# Patient Record
Sex: Female | Born: 1975 | Race: White | Hispanic: No | Marital: Married | Smoking: Never smoker
Health system: Southern US, Community
[De-identification: ages and names within clinical notes are randomized; demographics above are authoritative.]

## PROBLEM LIST (undated history)

## (undated) DIAGNOSIS — F32A Depression, unspecified: Secondary | ICD-10-CM

## (undated) DIAGNOSIS — F419 Anxiety disorder, unspecified: Secondary | ICD-10-CM

## (undated) DIAGNOSIS — Z8489 Family history of other specified conditions: Secondary | ICD-10-CM

## (undated) DIAGNOSIS — F329 Major depressive disorder, single episode, unspecified: Secondary | ICD-10-CM

## (undated) DIAGNOSIS — N904 Leukoplakia of vulva: Secondary | ICD-10-CM

## (undated) HISTORY — DX: Anxiety disorder, unspecified: F41.9

## (undated) HISTORY — DX: Depression, unspecified: F32.A

## (undated) HISTORY — DX: Leukoplakia of vulva: N90.4

---
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
## 1998-07-08 ENCOUNTER — Other Ambulatory Visit: Admission: RE | Admit: 1998-07-08 | Discharge: 1998-07-08 | Payer: Self-pay | Admitting: *Deleted

## 1999-09-08 ENCOUNTER — Other Ambulatory Visit: Admission: RE | Admit: 1999-09-08 | Discharge: 1999-09-08 | Payer: Self-pay | Admitting: *Deleted

## 2000-07-10 HISTORY — PX: WISDOM TOOTH EXTRACTION: SHX21

## 2001-07-29 ENCOUNTER — Other Ambulatory Visit: Admission: RE | Admit: 2001-07-29 | Discharge: 2001-07-29 | Payer: Self-pay | Admitting: *Deleted

## 2002-08-12 ENCOUNTER — Other Ambulatory Visit: Admission: RE | Admit: 2002-08-12 | Discharge: 2002-08-12 | Payer: Self-pay | Admitting: Obstetrics and Gynecology

## 2003-09-18 ENCOUNTER — Other Ambulatory Visit: Admission: RE | Admit: 2003-09-18 | Discharge: 2003-09-18 | Payer: Self-pay | Admitting: Obstetrics and Gynecology

## 2004-10-07 ENCOUNTER — Other Ambulatory Visit: Admission: RE | Admit: 2004-10-07 | Discharge: 2004-10-07 | Payer: Self-pay | Admitting: Obstetrics and Gynecology

## 2005-11-02 ENCOUNTER — Other Ambulatory Visit: Admission: RE | Admit: 2005-11-02 | Discharge: 2005-11-02 | Payer: Self-pay | Admitting: Obstetrics & Gynecology

## 2006-11-05 ENCOUNTER — Ambulatory Visit (HOSPITAL_COMMUNITY): Admission: RE | Admit: 2006-11-05 | Discharge: 2006-11-05 | Payer: Self-pay | Admitting: Obstetrics and Gynecology

## 2006-11-20 ENCOUNTER — Ambulatory Visit (HOSPITAL_COMMUNITY): Admission: RE | Admit: 2006-11-20 | Discharge: 2006-11-20 | Payer: Self-pay | Admitting: Obstetrics and Gynecology

## 2007-03-24 ENCOUNTER — Inpatient Hospital Stay (HOSPITAL_COMMUNITY): Admission: AD | Admit: 2007-03-24 | Discharge: 2007-03-24 | Payer: Self-pay | Admitting: Obstetrics and Gynecology

## 2007-03-24 ENCOUNTER — Inpatient Hospital Stay (HOSPITAL_COMMUNITY): Admission: AD | Admit: 2007-03-24 | Discharge: 2007-03-27 | Payer: Self-pay | Admitting: Obstetrics & Gynecology

## 2007-12-09 ENCOUNTER — Encounter: Payer: Self-pay | Admitting: Obstetrics & Gynecology

## 2007-12-09 DIAGNOSIS — N904 Leukoplakia of vulva: Secondary | ICD-10-CM

## 2007-12-09 HISTORY — DX: Leukoplakia of vulva: N90.4

## 2008-11-13 ENCOUNTER — Other Ambulatory Visit: Admission: RE | Admit: 2008-11-13 | Discharge: 2008-11-13 | Payer: Self-pay | Admitting: Obstetrics and Gynecology

## 2009-04-27 IMAGING — US US MD EVAL AND MANAGEMENT - NRPT [facility]
1 series · 14 of 16 positions shown · non-contrast
Comparison: none

OBSTETRICAL ULTRASOUND:
 This ultrasound was performed in The [HOSPITAL], and the [facility] report will be stored to [REDACTED] PACS.

[Series 1: us md eval and management - nrpt [facility] · 14 of 95 slices shown]
[im 1/95]
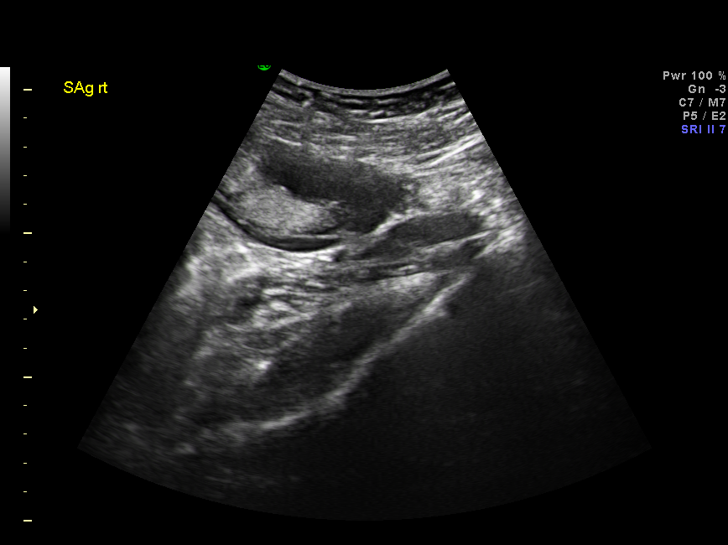
[im 7/95]
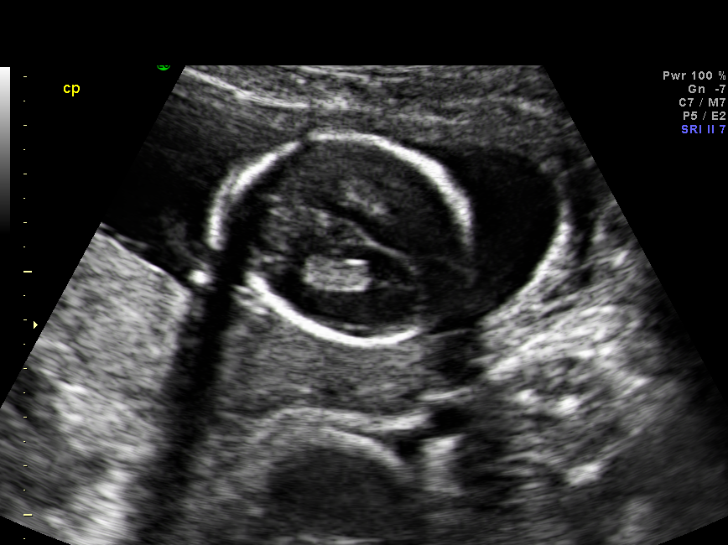
[im 13/95]
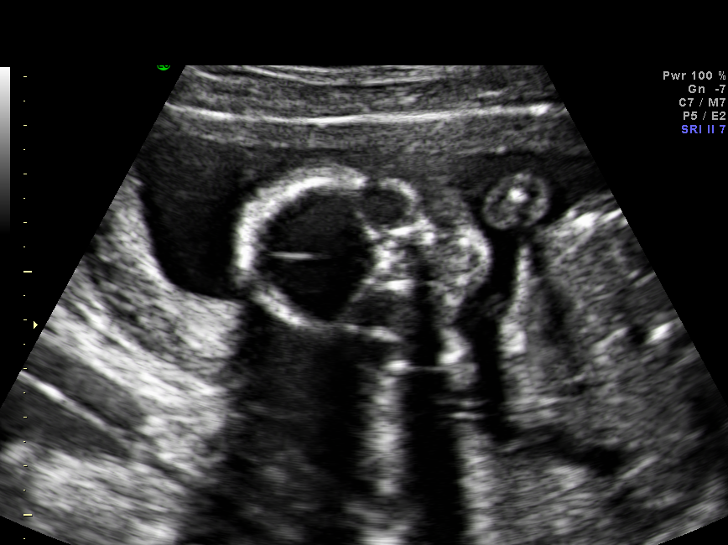
[im 26/95]
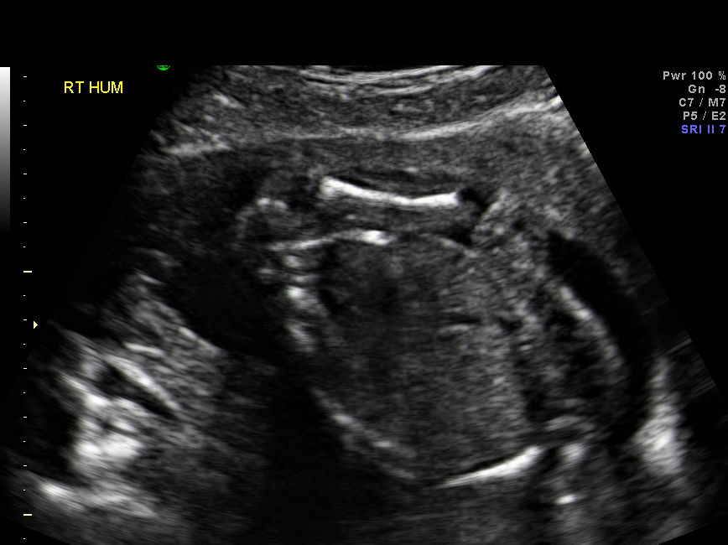
[im 32/95]
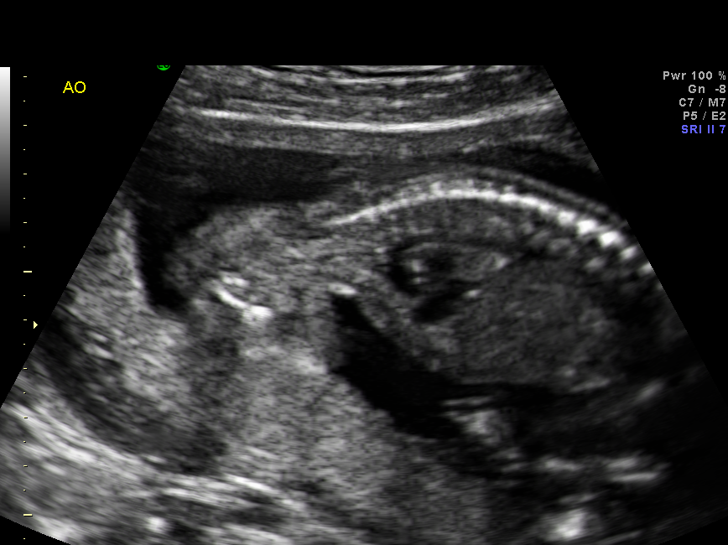
[im 38/95]
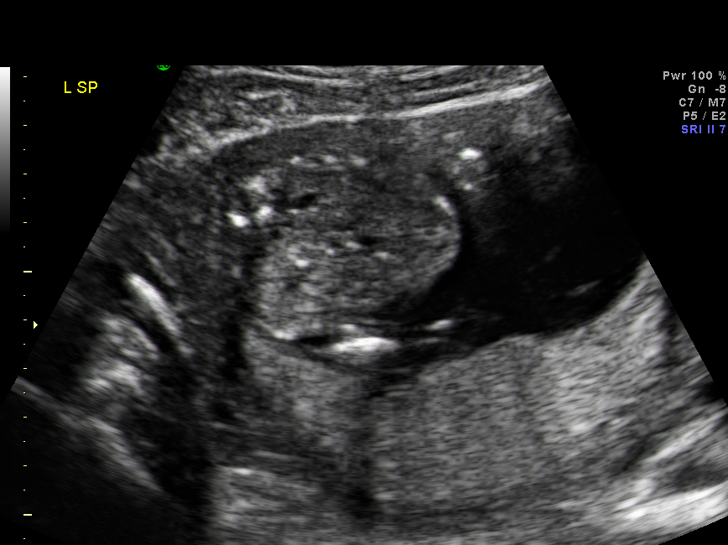
[im 44/95]
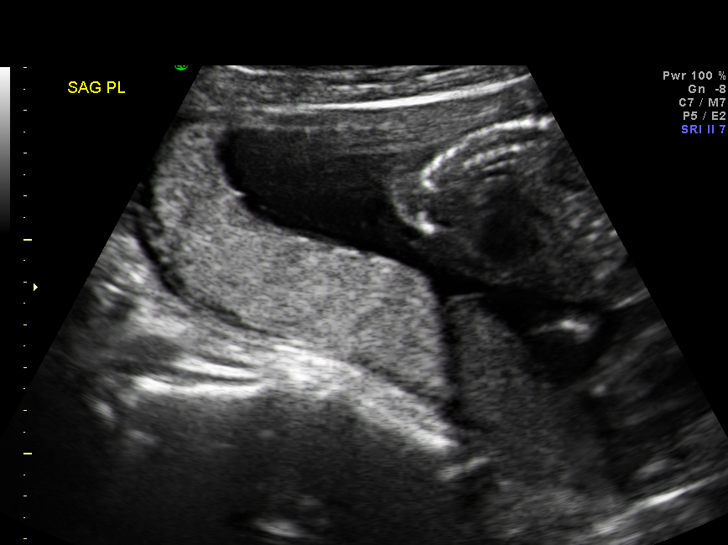
[im 51/95]
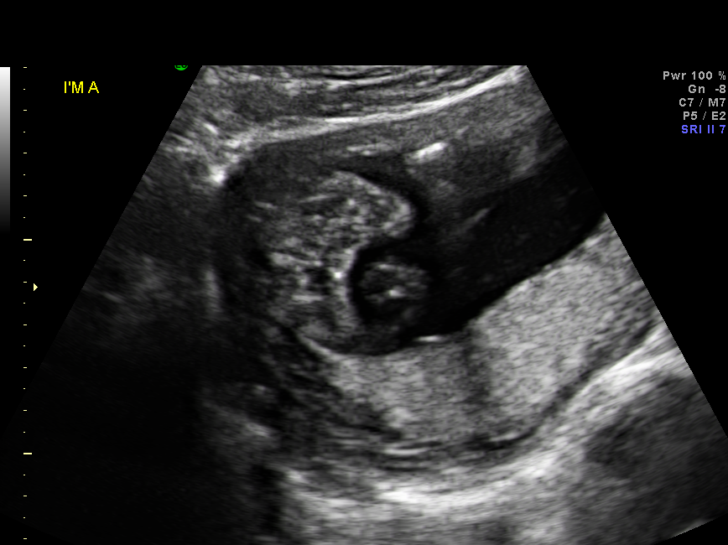
[im 57/95]
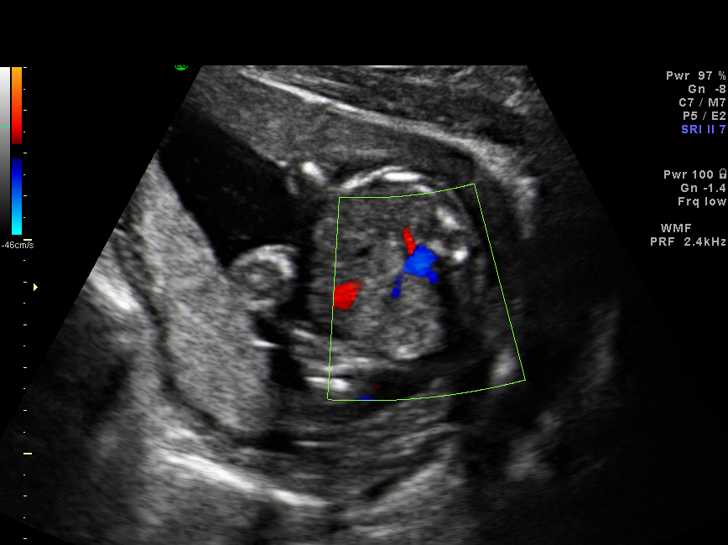
[im 63/95]
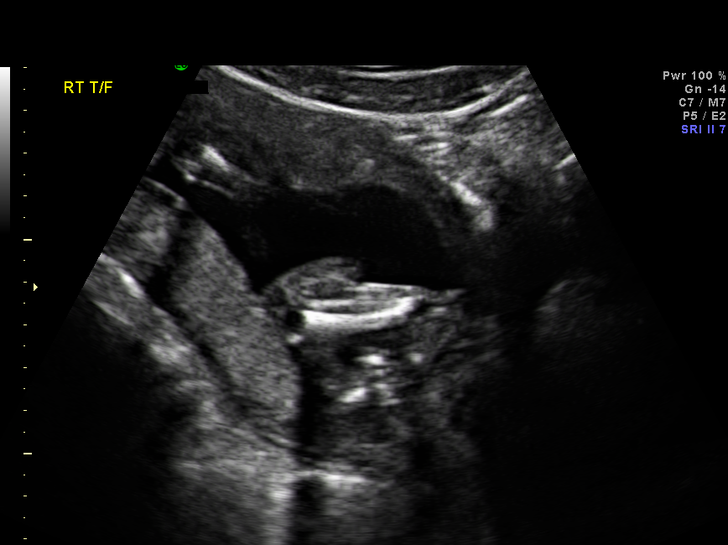
[im 76/95]
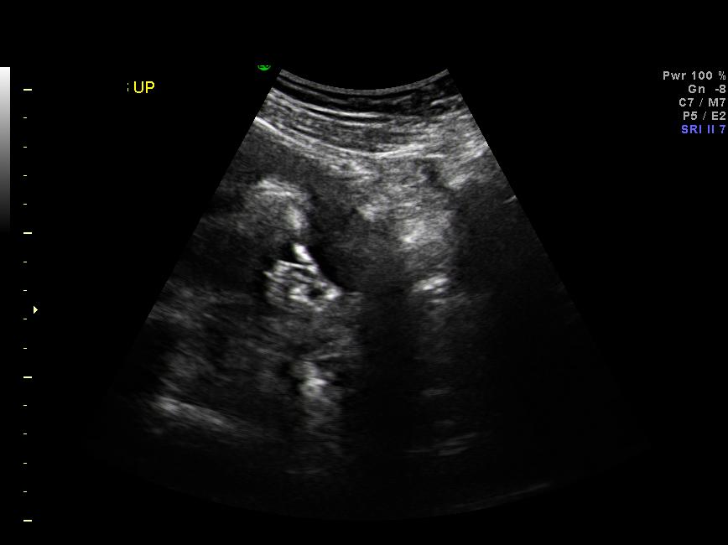
[im 82/95]
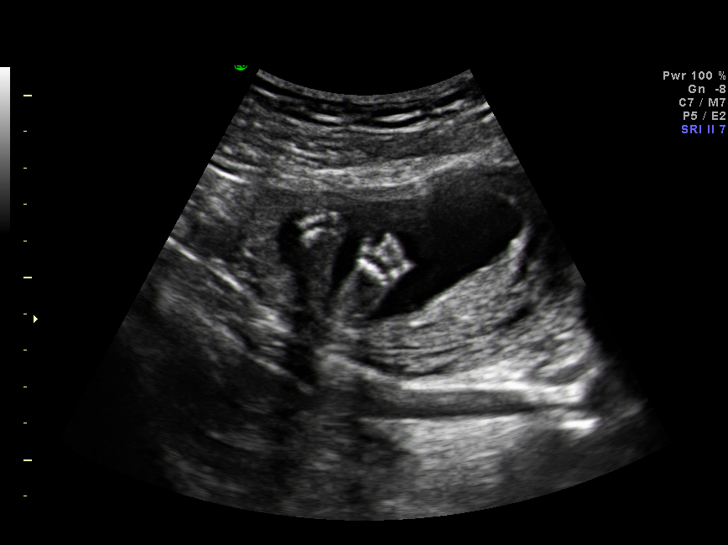
[im 88/95]
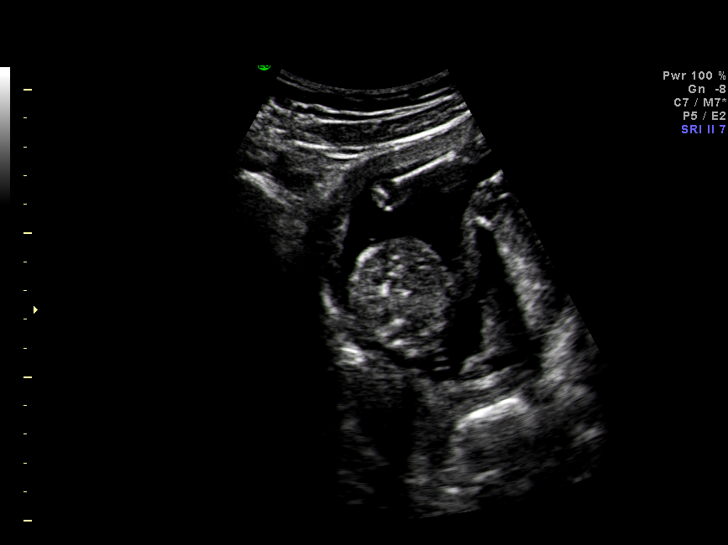
[im 95/95]
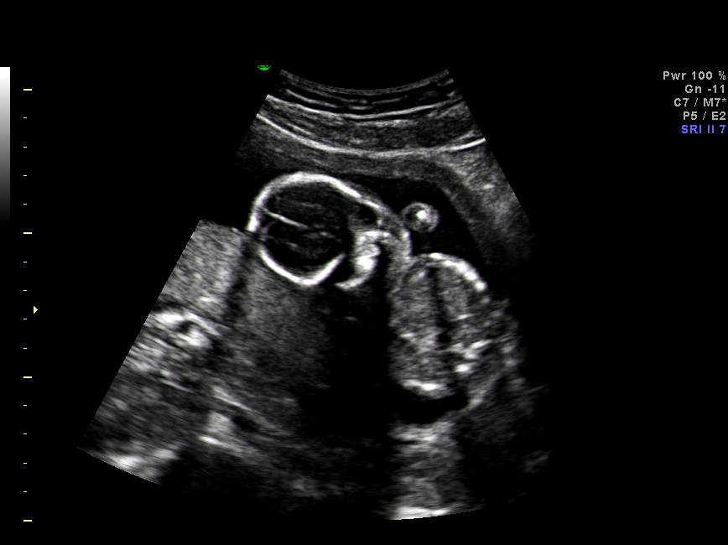

[14 of 16 positions shown; findings below may reference images not displayed]

IMPRESSION: The [facility] report has also been faxed to the ordering physician.

## 2009-05-12 IMAGING — US US OB FOLLOW-UP
1 series · 14 of 28 positions shown · non-contrast
Comparison: none

OBSTETRICAL ULTRASOUND:
 This ultrasound was performed in The [facility]OSP[facility]], and the [facility] report will be stored to [REDACTED] PACS.

[Series 1: us ob follow-up · 14 of 60 slices shown]
[im 3/60]
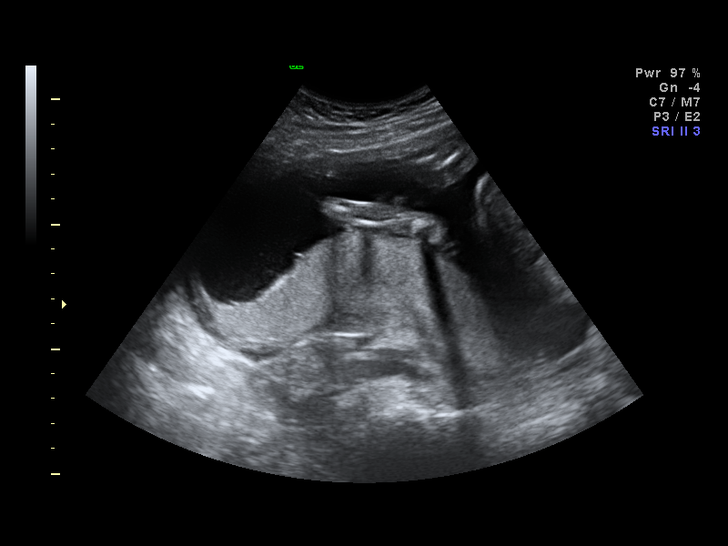
[im 7/60]
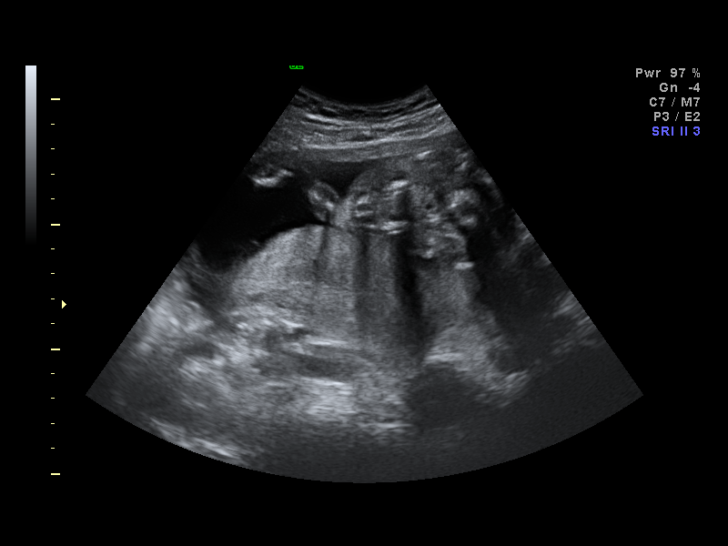
[im 11/60]
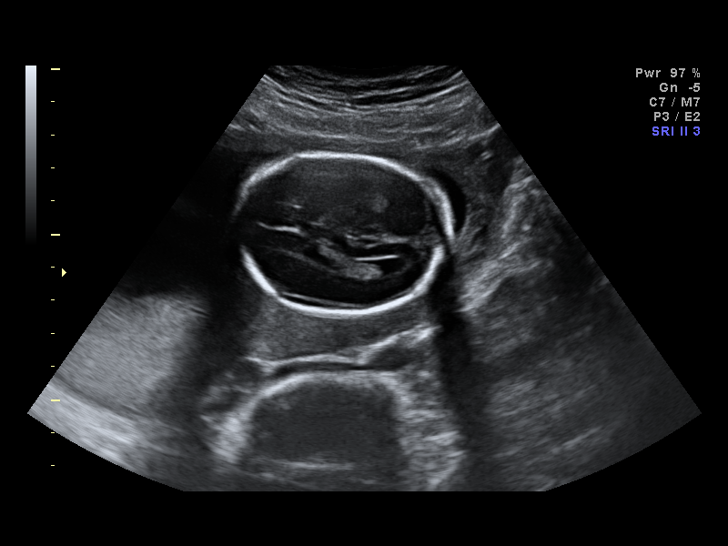
[im 16/60]
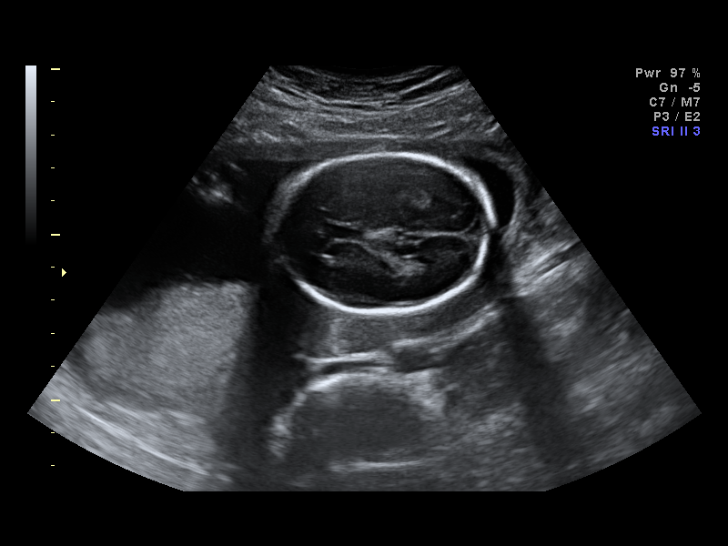
[im 20/60]
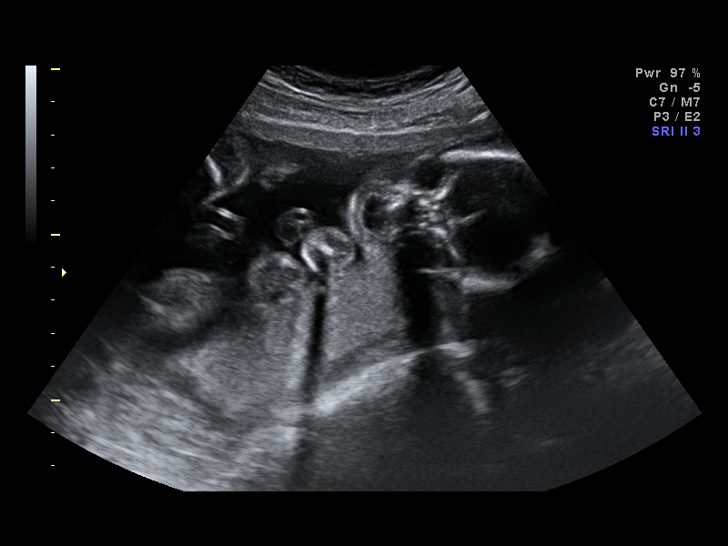
[im 25/60]
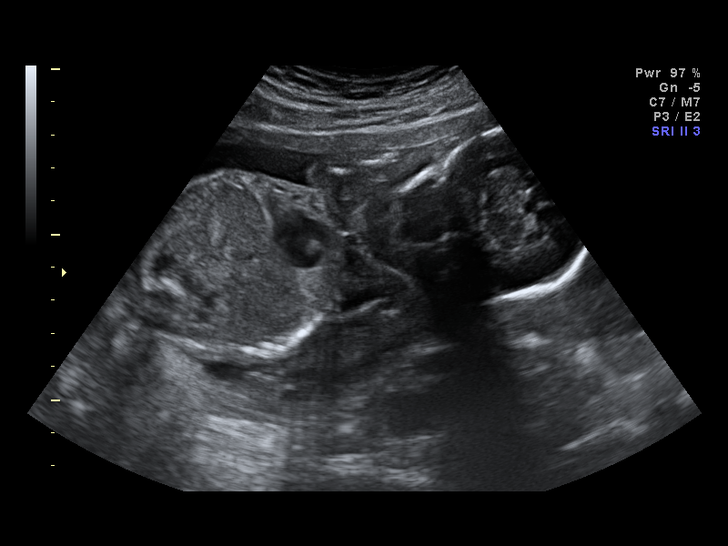
[im 29/60]
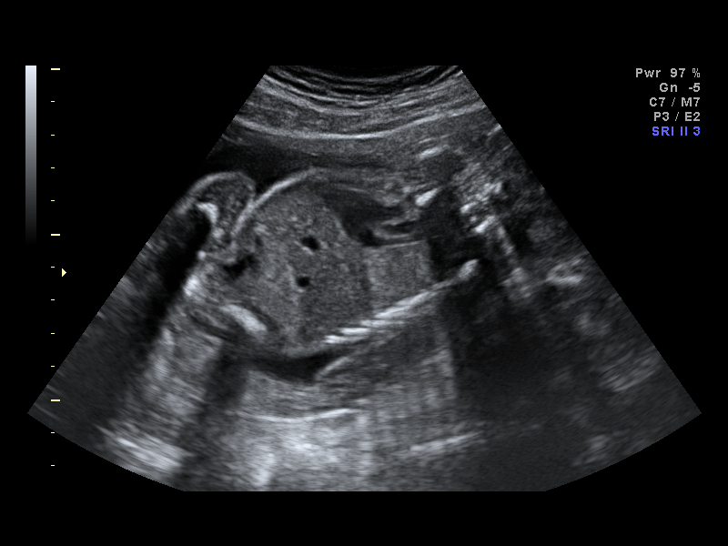
[im 33/60]
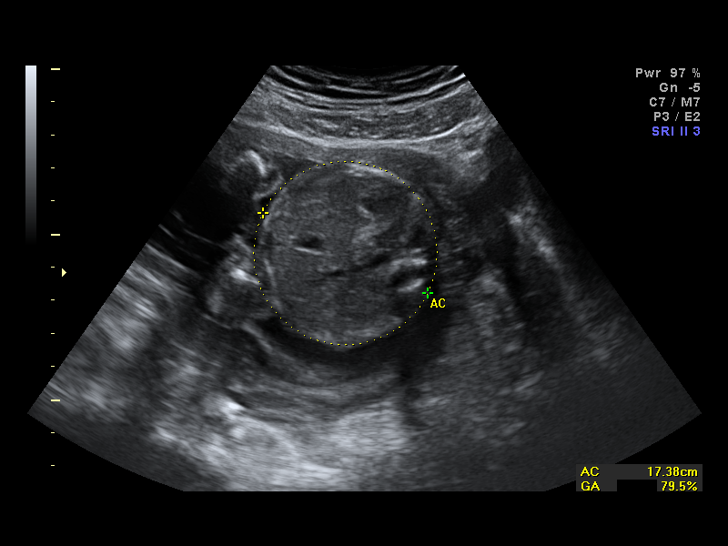
[im 38/60]
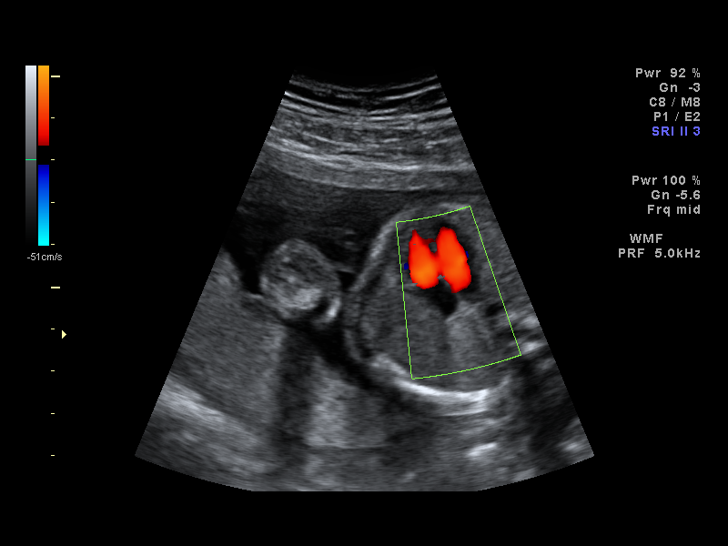
[im 42/60]
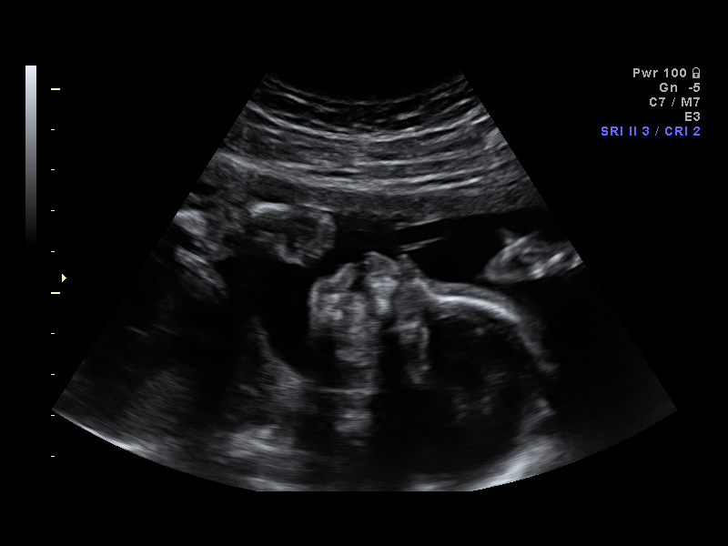
[im 46/60]
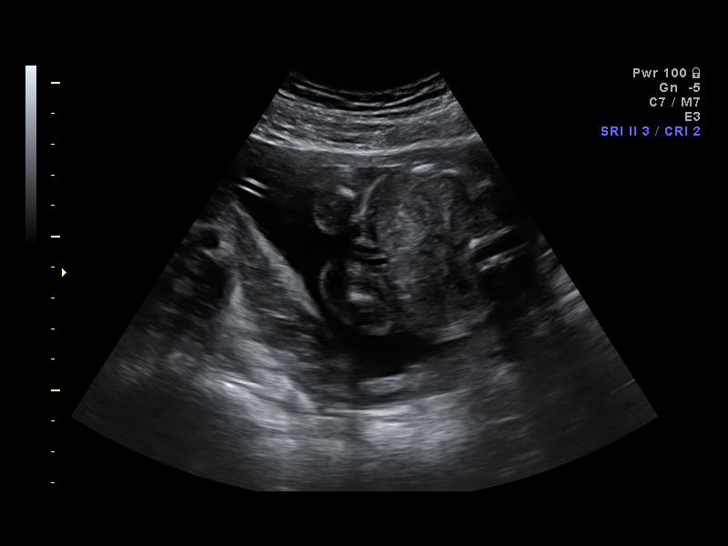
[im 51/60]
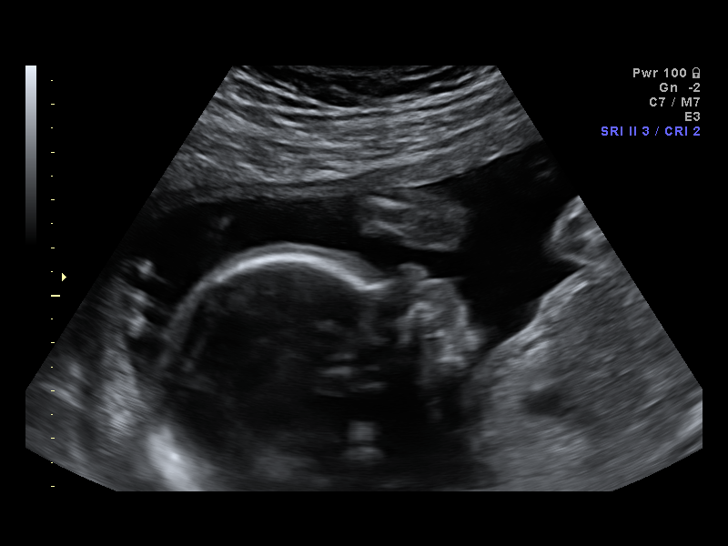
[im 55/60]
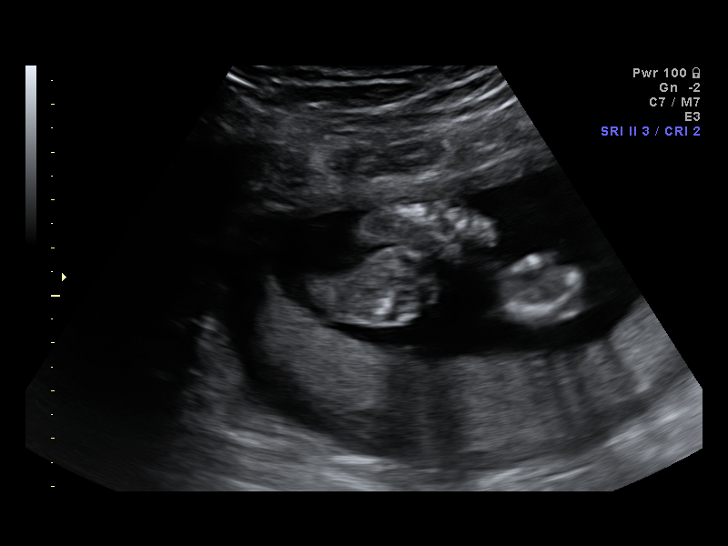
[im 60/60]
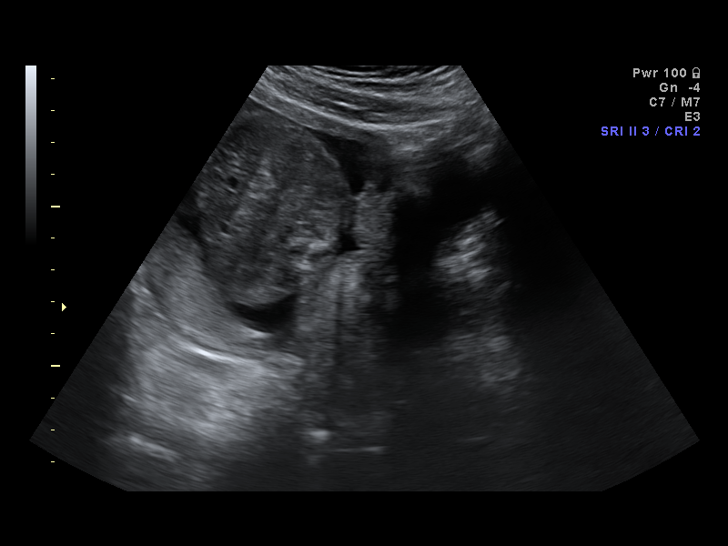

[14 of 28 positions shown; findings below may reference images not displayed]

IMPRESSION: The [facility] report has also been faxed to the ordering physician.

## 2009-10-31 ENCOUNTER — Inpatient Hospital Stay (HOSPITAL_COMMUNITY): Admission: AD | Admit: 2009-10-31 | Discharge: 2009-11-01 | Payer: Self-pay | Admitting: Obstetrics

## 2010-07-31 ENCOUNTER — Encounter: Payer: Self-pay | Admitting: Obstetrics and Gynecology

## 2010-09-27 LAB — CBC
MCHC: 34 g/dL (ref 30.0–36.0)
Platelets: 182 10*3/uL (ref 150–400)
Platelets: 200 10*3/uL (ref 150–400)
RBC: 4.02 MIL/uL (ref 3.87–5.11)
RDW: 13.1 % (ref 11.5–15.5)
WBC: 12.2 10*3/uL — ABNORMAL HIGH (ref 4.0–10.5)

## 2010-09-27 LAB — CCBB MATERNAL DONOR DRAW

## 2010-11-22 NOTE — H&P (Signed)
Victoria Mcintyre, Victoria Mcintyre              ACCOUNT NO.:  [id number]   MEDICAL RECORD NO.:  [id number]          PATIENT TYPE:  INP   LOCATION:  9166                          FACILITY:  [facility]   PHYSICIAN:  Lenoard Aden, M.D.DATE OF BIRTH:  05/16/1976   DATE OF ADMISSION:  03/24/2007  DATE OF DISCHARGE:                              HISTORY & PHYSICAL   CHIEF COMPLAINT:  Labor.   HISTORY OF PRESENT ILLNESS:  She is a 35 year old white female, gravida  2, para 0 at term with spontaneous rupture of membranes in active labor  who presents.  She has no known drug allergies.   MEDICATIONS:  Prenatal vitamins.   SOCIAL AND FAMILY HISTORY:  Noncontributory.   MEDICAL HISTORY:  Noncontributory.   PHYSICAL EXAMINATION:  GENERAL:  She is a well-developed, well-nourished  white female in no acute distress.  HEENT:  Normal.  LUNGS:  Clear.  HEART:  Regular rhythm.  ABDOMEN:  Soft, gravid, nontender.  Estimated fetal weight approximately  7-1/2 to 8 pounds.  PELVIC EXAMINATION:  Cervix is 10 cm, LOT, vertex +1 to +2.  EXTREMITIES:  No clubbing, cyanosis or edema.  NEUROLOGIC:  Examination is nonfocal.  SKIN:  Intact.   DATA REVIEWED:  Fetal heart rate is reassuring, contractions are every 2  minutes.  Group B strep is positive.   IMPRESSION:  1. A 39 week intrauterine pregnancy.  2. Group B strep positive.  3. Active labor.   PLAN:  Proceed with second stage of labor.  Will begin pushing.  Anticipate attempted vaginal delivery.      Lenoard Aden, M.D.  Electronically Signed     RJT/MEDQ  D:  03/25/2007  T:  03/25/2007  Job:  [id number]

## 2010-11-22 NOTE — Consult Note (Signed)
NAMESHALENE, GALLEN NO.:  [id number]   MEDICAL RECORD NO.:  [id number]          PATIENT TYPE:  MAT   LOCATION:  [facility]C                          FACILITY:  [facility]   PHYSICIAN:  Lenoard Aden, M.D.DATE OF BIRTH:  1976-04-29   DATE OF CONSULTATION:  DATE OF DISCHARGE:                                 CONSULTATION   CHIEF COMPLAINT:  Rule out labor.   She is a 35 year old white female, G1, P0, at 38-6/7th's weeks'  gestation with increased frequency of contractions today.  She denies  leakage of fluid.  She denies vaginal bleeding.   She has no known drug allergies.   MEDICATIONS:  Prenatal vitamins.   FAMILY HISTORY:  Heart disease, breast cancer, seizure disorder,  anklyosing spondylitis, pancreatic cancer, and brain issues.   She is a nonsmoker, nondrinker.  She denies domestic or physical  violence.   Her pregnancy history is noncontributory.  Her pregnancy course has been  complicated by size/date discrepancy and Group B strep.   PHYSICAL EXAMINATION:  GENERAL:  She is a well-developed, well-  nourished, white female in no acute distress.  HEENT:  Normal.  LUNGS:  Clear.  HEART:  Regular rhythm.  ABDOMEN:  Soft, gravid, nontender.  Estimated fetal weight 8 pounds.  PELVIC:  Cervix is 2-cm, 60% vertex, minus 1.  EXTREMITIES:  Reveal no cords.  NEUROLOGIC:  Nonfocal.   NST is reactive.  Irregular contractions are noted.   IMPRESSION:  Prodromal labor pattern, no evidence of progressive  cervical change.   PLAN:  1. Discharge home.  2. Labor warnings given.  3. Ambien given.  4. Follow up in the office in 24-48 hours.      Lenoard Aden, M.D.  Electronically Signed     RJT/MEDQ  D:  03/24/2007  T:  03/24/2007  Job:  [id number]

## 2010-11-22 NOTE — Op Note (Signed)
Victoria Mcintyre, MAESTRE              ACCOUNT NO.:  [id number]   MEDICAL RECORD NO.:  [id number]          PATIENT TYPE:  INP   LOCATION:  9126                          FACILITY:  [facility]   PHYSICIAN:  Lenoard Aden, M.D.DATE OF BIRTH:  [birth year]   DATE OF PROCEDURE:  03/25/2007  DATE OF DISCHARGE:                               OPERATIVE REPORT   PREOPERATIVE DIAGNOSIS:  Maternal exhaustion, recurrence of deep  variable decelerations.   POSTOPERATIVE DIAGNOSIS:  Maternal exhaustion, recurrence of deep  variable decelerations.   PROCEDURE:  Outlet vacuum-assisted vaginal delivery.   SURGEON:  Lenoard Aden, M.D.   ANESTHESIA:  Epidural.   BLOOD LOSS:  500 mL.   COMPLICATIONS:  None.   DRAINS:  None.   COUNTS:  Correct.   Patient to recovery in good condition.   BRIEF OP NOTE:  After being apprised of risks, benefits of vacuum  assistance include small incidence of cephalohematoma, scalp laceration,  intracranial hemorrhage, Kiwi cup was placed.  Fetal vertex OA for three  pulls, one pop-off, full-term living female over second-degree midline  laceration, Apgars 08/09.  Cord blood collected.  Placenta delivered  manually intact.  No vaginal or cervical lacerations noted.  Second-  degree midline perineal laceration repaired with a 3-0 Rapide in the  standard fashion.  Estimated blood loss 500 mL.  No complications.  Bulb  suctioning performed.  Placenta delivered spontaneously intact.  Mother  and baby recovery in good condition.      Lenoard Aden, M.D.  Electronically Signed     RJT/MEDQ  D:  03/25/2007  T:  03/25/2007  Job:  [id number]

## 2011-04-20 LAB — CBC
HCT: 33.2 — ABNORMAL LOW
Hemoglobin: 11.3 — ABNORMAL LOW
Platelets: 191
WBC: 13.2 — ABNORMAL HIGH

## 2011-04-21 LAB — CBC
HCT: 39.7
Hemoglobin: 13.7
RBC: 4.44
RDW: 13.4

## 2012-11-04 ENCOUNTER — Other Ambulatory Visit: Payer: Self-pay | Admitting: *Deleted

## 2012-11-04 MED ORDER — NORGESTIM-ETH ESTRAD TRIPHASIC 0.18/0.215/0.25 MG-25 MCG PO TABS: 1.0000 | ORAL_TABLET | Freq: Every day | ORAL | Status: DC

## 2012-11-04 MED ORDER — FLUCONAZOLE 150 MG PO TABS: 150.0000 mg | ORAL_TABLET | Freq: Once | ORAL | Status: DC

## 2012-11-27 ENCOUNTER — Encounter: Payer: Self-pay | Admitting: *Deleted

## 2012-11-28 ENCOUNTER — Ambulatory Visit (INDEPENDENT_AMBULATORY_CARE_PROVIDER_SITE_OTHER): Payer: 59 | Admitting: Nurse Practitioner

## 2012-11-28 ENCOUNTER — Encounter: Payer: Self-pay | Admitting: Nurse Practitioner

## 2012-11-28 VITALS — BP 110/60 | HR 64 | Ht 67.0 in | Wt 135.8 lb

## 2012-11-28 DIAGNOSIS — Z0000 Encounter for general adult medical examination without abnormal findings: Secondary | ICD-10-CM

## 2012-11-28 DIAGNOSIS — IMO0001 Reserved for inherently not codable concepts without codable children: Secondary | ICD-10-CM

## 2012-11-28 DIAGNOSIS — Z309 Encounter for contraceptive management, unspecified: Secondary | ICD-10-CM

## 2012-11-28 DIAGNOSIS — Z01419 Encounter for gynecological examination (general) (routine) without abnormal findings: Secondary | ICD-10-CM

## 2012-11-28 LAB — POCT URINALYSIS DIPSTICK
Leukocytes, UA: NEGATIVE
Spec Grav, UA: 1.015

## 2012-11-28 MED ORDER — NORGESTIM-ETH ESTRAD TRIPHASIC 0.18/0.215/0.25 MG-25 MCG PO TABS: 1.0000 | ORAL_TABLET | Freq: Every day | ORAL | Status: DC

## 2012-11-28 NOTE — Patient Instructions (Addendum)
General topics  Next pap or exam is  due in 1 year Take a Women's multivitamin Take 1200 mg. of calcium daily - prefer dietary If any concerns in interim to call back  Breast Self-Awareness Practicing breast self-awareness may pick up problems early, prevent significant medical complications, and possibly save your life. By practicing breast self-awareness, you can become familiar with how your breasts look and feel and if your breasts are changing. This allows you to notice changes early. It can also offer you some reassurance that your breast health is good. One way to learn what is normal for your breasts and whether your breasts are changing is to do a breast self-exam. If you find a lump or something that was not present in the past, it is best to contact your caregiver right away. Other findings that should be evaluated by your caregiver include nipple discharge, especially if it is bloody; skin changes or reddening; areas where the skin seems to be pulled in (retracted); or new lumps and bumps. Breast pain is seldom associated with cancer (malignancy), but should also be evaluated by a caregiver. BREAST SELF-EXAM The best time to examine your breasts is 5 7 days after your menstrual period is over.  ExitCare Patient Information 2013 [employer].   Exercise to Stay Healthy Exercise helps you become and stay healthy. EXERCISE IDEAS AND TIPS Choose exercises that:  You enjoy.  Fit into your day. You do not need to exercise really hard to be healthy. You can do exercises at a slow or medium level and stay healthy. You can:  Stretch before and after working out.  Try yoga, Pilates, or tai chi.  Lift weights.  Walk fast, swim, jog, run, climb stairs, bicycle, dance, or rollerskate.  Take aerobic classes. Exercises that burn about 150 calories:  Running 1  miles in 15 minutes.  Playing volleyball for 45 to 60 minutes.  Washing and waxing a car for 45 to 60  minutes.  Playing touch football for 45 minutes.  Walking 1  miles in 35 minutes.  Pushing a stroller 1  miles in 30 minutes.  Playing basketball for 30 minutes.  Raking leaves for 30 minutes.  Bicycling 5 miles in 30 minutes.  Walking 2 miles in 30 minutes.  Dancing for 30 minutes.  Shoveling snow for 15 minutes.  Swimming laps for 20 minutes.  Walking up stairs for 15 minutes.  Bicycling 4 miles in 15 minutes.  Gardening for 30 to 45 minutes.  Jumping rope for 15 minutes.  Washing windows or floors for 45 to 60 minutes. Document Released: 07/29/2010 Document Revised: 09/18/2011 Document Reviewed: 07/29/2010 ExitCare Patient Information 2013 [employer].   Other topics ( that may be useful information):    Sexually Transmitted Disease Sexually transmitted disease (STD) refers to any infection that is passed from person to person during sexual activity. This may happen by way of saliva, semen, blood, vaginal mucus, or urine. Common STDs include:  Gonorrhea.  Chlamydia.  Syphilis.  HIV/AIDS.  Genital herpes.  Hepatitis B and C.  Trichomonas.  Human papillomavirus (HPV).  Pubic lice. CAUSES  An STD may be spread by bacteria, virus, or parasite. A person can get an STD by:  Sexual intercourse with an infected person.  Sharing sex toys with an infected person.  Sharing needles with an infected person.  Having intimate contact with the genitals, mouth, or rectal areas of an infected person. SYMPTOMS  Some people may not have any symptoms, but   they can still pass the infection to others. Different STDs have different symptoms. Symptoms include:  Painful or bloody urination.  Pain in the pelvis, abdomen, vagina, anus, throat, or eyes.  Skin rash, itching, irritation, growths, or sores (lesions). These usually occur in the genital or anal area.  Abnormal vaginal discharge.  Penile discharge in men.  Soft, flesh-colored skin growths in the  genital or anal area.  Fever.  Pain or bleeding during sexual intercourse.  Swollen glands in the groin area.  Yellow skin and eyes (jaundice). This is seen with hepatitis. DIAGNOSIS  To make a diagnosis, your caregiver may:  Take a medical history.  Perform a physical exam.  Take a specimen (culture) to be examined.  Examine a sample of discharge under a microscope.  Perform blood test TREATMENT   Chlamydia, gonorrhea, trichomonas, and syphilis can be cured with antibiotic medicine.  Genital herpes, hepatitis, and HIV can be treated, but not cured, with prescribed medicines. The medicines will lessen the symptoms.  Genital warts from HPV can be treated with medicine or by freezing, burning (electrocautery), or surgery. Warts may come back.  HPV is a virus and cannot be cured with medicine or surgery.However, abnormal areas may be followed very closely by your caregiver and may be removed from the cervix, vagina, or vulva through office procedures or surgery. If your diagnosis is confirmed, your recent sexual partners need treatment. This is true even if they are symptom-free or have a negative culture or evaluation. They should not have sex until their caregiver says it is okay. HOME CARE INSTRUCTIONS  All sexual partners should be informed, tested, and treated for all STDs.  Take your antibiotics as directed. Finish them even if you start to feel better.  Only take over-the-counter or prescription medicines for pain, discomfort, or fever as directed by your caregiver.  Rest.  Eat a balanced diet and drink enough fluids to keep your urine clear or pale yellow.  Do not have sex until treatment is completed and you have followed up with your caregiver. STDs should be checked after treatment.  Keep all follow-up appointments, Pap tests, and blood tests as directed by your caregiver.  Only use latex condoms and water-soluble lubricants during sexual activity. Do not use  petroleum jelly or oils.  Avoid alcohol and illegal drugs.  Get vaccinated for HPV and hepatitis. If you have not received these vaccines in the past, talk to your caregiver about whether one or both might be right for you.  Avoid risky sex practices that can break the skin. The only way to avoid getting an STD is to avoid all sexual activity.Latex condoms and dental dams (for oral sex) will help lessen the risk of getting an STD, but will not completely eliminate the risk. SEEK MEDICAL CARE IF:   You have a fever.  You have any new or worsening symptoms. Document Released: 09/16/2002 Document Revised: 09/18/2011 Document Reviewed: 09/23/2010 ExitCare Patient Information 2013 [employer].    Domestic Abuse You are being battered or abused if someone close to you hits, pushes, or physically hurts you in any way. You also are being abused if you are forced into activities. You are being sexually abused if you are forced to have sexual contact of any kind. You are being emotionally abused if you are made to feel worthless or if you are constantly threatened. It is important to remember that help is available. No one has the right to abuse you. PREVENTION OF FURTHER   ABUSE  Learn the warning signs of danger. This varies with situations but may include: the use of alcohol, threats, isolation from friends and family, or forced sexual contact. Leave if you feel that violence is going to occur.  If you are attacked or beaten, report it to the police so the abuse is documented. You do not have to press charges. The police can protect you while you or the attackers are leaving. Get the officer's name and badge number and a copy of the report.  Find someone you can trust and tell them what is happening to you: your caregiver, a nurse, clergy member, close friend or family member. Feeling ashamed is natural, but remember that you have done nothing wrong. No one deserves abuse. Document Released:  06/23/2000 Document Revised: 09/18/2011 Document Reviewed: 09/01/2010 ExitCare Patient Information 2013 [employer].    How Much is Too Much Alcohol? Drinking too much alcohol can cause injury, accidents, and health problems. These types of problems can include:   Car crashes.  Falls.  Family fighting (domestic violence).  Drowning.  Fights.  Injuries.  Burns.  Damage to certain organs.  Having a baby with birth defects. ONE DRINK CAN BE TOO MUCH WHEN YOU ARE:  Working.  Pregnant or breastfeeding.  Taking medicines. Ask your doctor.  Driving or planning to drive. If you or someone you know has a drinking problem, get help from a doctor.  Document Released: 04/22/2009 Document Revised: 09/18/2011 Document Reviewed: 04/22/2009 ExitCare Patient Information 2013 [employer].   Smoking Hazards Smoking cigarettes is extremely bad for your health. Tobacco smoke has over 200 known poisons in it. There are over 60 chemicals in tobacco smoke that cause cancer. Some of the chemicals found in cigarette smoke include:   Cyanide.  Benzene.  Formaldehyde.  Methanol (wood alcohol).  Acetylene (fuel used in welding torches).  Ammonia. Cigarette smoke also contains the poisonous gases nitrogen oxide and carbon monoxide.  Cigarette smokers have an increased risk of many serious medical problems and Smoking causes approximately:  90% of all lung cancer deaths in men.  80% of all lung cancer deaths in women.  90% of deaths from chronic obstructive lung disease. Compared with nonsmokers, smoking increases the risk of:  Coronary heart disease by 2 to 4 times.  Stroke by 2 to 4 times.  Men developing lung cancer by 23 times.  Women developing lung cancer by 13 times.  Dying from chronic obstructive lung diseases by 12 times.  . Smoking is the most preventable cause of death and disease in our society.  WHY IS SMOKING ADDICTIVE?  Nicotine is the chemical  agent in tobacco that is capable of causing addiction or dependence.  When you smoke and inhale, nicotine is absorbed rapidly into the bloodstream through your lungs. Nicotine absorbed through the lungs is capable of creating a powerful addiction. Both inhaled and non-inhaled nicotine may be addictive.  Addiction studies of cigarettes and spit tobacco show that addiction to nicotine occurs mainly during the teen years, when young people begin using tobacco products. WHAT ARE THE BENEFITS OF QUITTING?  There are many health benefits to quitting smoking.   Likelihood of developing cancer and heart disease decreases. Health improvements are seen almost immediately.  Blood pressure, pulse rate, and breathing patterns start returning to normal soon after quitting. QUITTING SMOKING   American Lung Association - 1-800-LUNGUSA  American Cancer Society - 1-800-ACS-2345 Document Released: 08/03/2004 Document Revised: 09/18/2011 Document Reviewed: 04/07/2009 ExitCare Patient Information 2013 [employer].   Stress Management Stress is a state of physical or mental tension that often results from changes in your life or normal routine. Some common causes of stress are:  Death of a loved one.  Injuries or severe illnesses.  Getting fired or changing jobs.  Moving into a new home. Other causes may be:  Sexual problems.  Business or financial losses.  Taking on a large debt.  Regular conflict with someone at home or at work.  Constant tiredness from lack of sleep. It is not just bad things that are stressful. It may be stressful to:  Win the lottery.  Get married.  Buy a new car. The amount of stress that can be easily tolerated varies from person to person. Changes generally cause stress, regardless of the types of change. Too much stress can affect your health. It may lead to physical or emotional problems. Too little stress (boredom) may also become stressful. SUGGESTIONS TO  REDUCE STRESS:  Talk things over with your family and friends. It often is helpful to share your concerns and worries. If you feel your problem is serious, you may want to get help from a professional counselor.  Consider your problems one at a time instead of lumping them all together. Trying to take care of everything at once may seem impossible. List all the things you need to do and then start with the most important one. Set a goal to accomplish 2 or 3 things each day. If you expect to do too many in a single day you will naturally fail, causing you to feel even more stressed.  Do not use alcohol or drugs to relieve stress. Although you may feel better for a short time, they do not remove the problems that caused the stress. They can also be habit forming.  Exercise regularly - at least 3 times per week. Physical exercise can help to relieve that "uptight" feeling and will relax you.  The shortest distance between despair and hope is often a good night's sleep.  Go to bed and get up on time allowing yourself time for appointments without being rushed.  Take a short "time-out" period from any stressful situation that occurs during the day. Close your eyes and take some deep breaths. Starting with the muscles in your face, tense them, hold it for a few seconds, then relax. Repeat this with the muscles in your neck, shoulders, hand, stomach, back and legs.  Take good care of yourself. Eat a balanced diet and get plenty of rest.  Schedule time for having fun. Take a break from your daily routine to relax. HOME CARE INSTRUCTIONS   Call if you feel overwhelmed by your problems and feel you can no longer manage them on your own.  Return immediately if you feel like hurting yourself or someone else. Document Released: 12/20/2000 Document Revised: 09/18/2011 Document Reviewed: 08/12/2007 ExitCare Patient Information 2013 [employer].   

## 2012-11-28 NOTE — Progress Notes (Signed)
37 y.o. G2P2 Married Caucasian Fe here for annual exam.  Menses last 5 days, moderate to light, no cramps. Not planning another pregnancy right now. Victoria Mcintyre is 37 years old. No recent flare of LSA - usually will flare with menses.  Patient's last menstrual period was 11/20/2012.          Sexually active: yes  The current method of family planning is OCP (estrogen/progesterone).    Exercising: yes  aerobics Smoker:  no  Health Maintenance: Pap:  11/28/2011  Normal  With neg HR HPV MMG:  never TDaP:  10/2008 Labs: Hgb-13.9    reports that she has never smoked. She does not have any smokeless tobacco history on file. She reports that she drinks about 1.2 ounces of alcohol per week. She reports that she does not use illicit drugs.  Past Medical History  Diagnosis Date  . Vulvitis 12/2007    Past Surgical History  Procedure Laterality Date  . Vaginal delivery      x2    Current Outpatient Prescriptions  Medication Sig Dispense Refill  . Multiple Vitamins-Minerals (MULTIVITAMIN PO) Take by mouth daily.      . Norgestimate-Ethinyl Estradiol Triphasic (ORTHO TRI-CYCLEN LO) 0.18/0.215/0.25 MG-25 MCG tab Take 1 tablet by mouth daily.  1 Package  0  . fluconazole (DIFLUCAN) 150 MG tablet Take 1 tablet (150 mg total) by mouth once.  1 tablet  1   No current facility-administered medications for this visit.    Family History  Problem Relation Age of Onset  . Osteoarthritis Father   . Asthma Brother   . Cancer Paternal Grandfather     lung cancer    ROS:  Pertinent items are noted in HPI.  Otherwise, a comprehensive ROS was negative.  Exam:   BP 110/60  Pulse 64  Ht 5\' 7"  (1.702 m)  Wt 135 lb 12.8 oz (61.598 kg)  BMI 21.26 kg/m2  LMP 11/20/2012 Height: 5\' 7"  (170.2 cm)  Ht Readings from Last 3 Encounters:  11/28/12 5\' 7"  (1.702 m)    General appearance: alert, cooperative and appears stated age Head: Normocephalic, without obvious abnormality, atraumatic Neck: no adenopathy,  supple, symmetrical, trachea midline and thyroid normal to inspection and palpation Lungs: clear to auscultation bilaterally Breasts: normal appearance, no masses or tenderness Heart: regular rate and rhythm Abdomen: soft, non-tender; no masses,  no organomegaly Extremities: extremities normal, atraumatic, no cyanosis or edema Skin: Skin color, texture, turgor normal. No rashes or lesions Lymph nodes: Cervical, supraclavicular, and axillary nodes normal. No abnormal inguinal nodes palpated Neurologic: Grossly normal              Pelvic: External genitalia:  no lesions - LSA changes as usual without flare.              Urethra:  normal appearing urethra with no masses, tenderness or lesions              Bartholin's and Skene's: normal                 Vagina: normal appearing vagina with normal color and discharge, no lesions              Cervix: anteverted              Pap taken: no Bimanual Exam:  Uterus:  normal size, contour, position, consistency, mobility, non-tender              Adnexa: no mass, fullness, tenderness  Rectovaginal: Confirms               Anus:  normal sphincter tone, no lesions  A:  Well Woman with normal exam  Contraception  History of LSA  P:   Pap smear as per guidelines   When needs a refill on Temovate to call back  counseled on adequate intake of calcium and vitamin D,   diet and exercise return annually or prn  An After Visit Summary was printed and given to the patient.

## 2012-11-29 LAB — HEMOGLOBIN, FINGERSTICK: Hemoglobin, fingerstick: 13.9 g/dL (ref 12.0–16.0)

## 2012-11-29 NOTE — Progress Notes (Signed)
Encounter reviewed by Dr. Myha Arizpe Silva.  

## 2012-11-30 ENCOUNTER — Other Ambulatory Visit: Payer: Self-pay | Admitting: Nurse Practitioner

## 2012-12-03 NOTE — Telephone Encounter (Signed)
eScribe request for refill on ORTHO TRICYCLEN LO Last filled - 11/28/12 X 1 YEAR Last AEX - 11/28/12 Next AEX - 12/04/13 RX denied.  RX sent at [facility] on 11/28/12.

## 2013-02-13 ENCOUNTER — Encounter: Payer: Self-pay | Admitting: Nurse Practitioner

## 2013-02-14 ENCOUNTER — Encounter: Payer: Self-pay | Admitting: Obstetrics and Gynecology

## 2013-02-14 ENCOUNTER — Ambulatory Visit (INDEPENDENT_AMBULATORY_CARE_PROVIDER_SITE_OTHER): Payer: 59 | Admitting: Obstetrics and Gynecology

## 2013-02-14 VITALS — BP 102/70 | HR 84 | Resp 20 | Ht 67.0 in | Wt 139.0 lb

## 2013-02-14 DIAGNOSIS — L293 Anogenital pruritus, unspecified: Secondary | ICD-10-CM

## 2013-02-14 NOTE — Progress Notes (Signed)
37 yo MWF G2P2 with two day h/o itching over mons pubis.  No real vaginal or vulvar itching.  Feels as if there are places where her skin is flaky.  No STD concerns.  Going to beach soon and wants to be sure this won't get worse while she is there.  Did use new fabric softener recently.  Exam:  Mons v slightly erythematous w/o focal lesions.  Vulva nl.  Vag also nl.  Cx w/o lesion.  BUS neg.       Abd soft and flat.    A:  Vulvar dermatitis.  Pt is blonde and fair skinned and is known to have sensitive skin, so this is probably a dermatitis.  P:  rewash all her underwear to get the fabric softener out of them.  Use OTC HC cream.  Bring her own soap and toilet paper with her to her beach vacation.      Return if worsens.

## 2013-03-24 ENCOUNTER — Other Ambulatory Visit: Payer: Self-pay | Admitting: *Deleted

## 2013-03-24 NOTE — Telephone Encounter (Signed)
Fax From: [facility] Last Refilled: 08/09/10 Aex: 11/28/12 Per Ms. Patty's note patient to call back when needed.  Please advise.

## 2013-03-25 MED ORDER — CLOBETASOL PROPIONATE 0.05 % EX OINT: TOPICAL_OINTMENT | Freq: Two times a day (BID) | CUTANEOUS | Status: DC

## 2013-03-27 ENCOUNTER — Other Ambulatory Visit: Payer: Self-pay | Admitting: *Deleted

## 2013-03-27 MED ORDER — CLOBETASOL PROPIONATE 0.05 % EX OINT: TOPICAL_OINTMENT | CUTANEOUS | Status: DC

## 2013-03-27 NOTE — Telephone Encounter (Signed)
RX was sent to [facility] instead of [facility]. [facility] at [facility] and sent to [facility].

## 2013-08-11 ENCOUNTER — Other Ambulatory Visit: Payer: Self-pay | Admitting: Nurse Practitioner

## 2013-09-09 ENCOUNTER — Encounter: Payer: Self-pay | Admitting: Nurse Practitioner

## 2013-10-27 ENCOUNTER — Telehealth: Payer: Self-pay | Admitting: *Deleted

## 2013-10-27 DIAGNOSIS — IMO0001 Reserved for inherently not codable concepts without codable children: Secondary | ICD-10-CM

## 2013-10-27 MED ORDER — NORGESTIM-ETH ESTRAD TRIPHASIC 0.18/0.215/0.25 MG-25 MCG PO TABS: 1.0000 | ORAL_TABLET | Freq: Every day | ORAL | Status: DC

## 2013-10-27 NOTE — Telephone Encounter (Signed)
Last AEX 11-28-12 with Shirlyn GoltzPatty Grubb, RX was sent for one year. Now scheduled for AEX 12-29-13 with Patty.  Refill for 3 month supply with o additional sent.  Routing to provider for final review. Patient agreeable to disposition. Will close encounter

## 2013-12-04 ENCOUNTER — Ambulatory Visit: Payer: 59 | Admitting: Nurse Practitioner

## 2013-12-29 ENCOUNTER — Ambulatory Visit (INDEPENDENT_AMBULATORY_CARE_PROVIDER_SITE_OTHER): Payer: 59 | Admitting: Nurse Practitioner

## 2013-12-29 ENCOUNTER — Encounter: Payer: Self-pay | Admitting: Nurse Practitioner

## 2013-12-29 VITALS — BP 118/62 | HR 68 | Ht 67.5 in | Wt 138.0 lb

## 2013-12-29 DIAGNOSIS — Z01419 Encounter for gynecological examination (general) (routine) without abnormal findings: Secondary | ICD-10-CM

## 2013-12-29 DIAGNOSIS — L940 Localized scleroderma [morphea]: Secondary | ICD-10-CM

## 2013-12-29 DIAGNOSIS — N904 Leukoplakia of vulva: Secondary | ICD-10-CM

## 2013-12-29 DIAGNOSIS — Z0000 Encounter for general adult medical examination without abnormal findings: Secondary | ICD-10-CM

## 2013-12-29 LAB — POCT URINALYSIS DIPSTICK
BILIRUBIN UA: NEGATIVE
Glucose, UA: NEGATIVE
KETONES UA: NEGATIVE
Nitrite, UA: NEGATIVE
PH UA: 7
Protein, UA: NEGATIVE
Urobilinogen, UA: NEGATIVE

## 2013-12-29 MED ORDER — CLOBETASOL PROPIONATE 0.05 % EX OINT: TOPICAL_OINTMENT | CUTANEOUS | Status: DC

## 2013-12-29 MED ORDER — NORGESTIM-ETH ESTRAD TRIPHASIC 0.18/0.215/0.25 MG-25 MCG PO TABS: 1.0000 | ORAL_TABLET | Freq: Every day | ORAL | Status: DC

## 2013-12-29 NOTE — Patient Instructions (Signed)

## 2013-12-29 NOTE — Progress Notes (Signed)
Patient ID: Victoria CocoCassidy Mcintyre, female   DOB: 10/24/1975, 38 y.o.   MRN: [id number] 38 y.o. G2P2 Married Caucasian Fe here for annual exam.  Menses is normal on OCP.  Lasting for 5-6 days.  Might consider another pregnancy within next 2 years.  She has a son age 734 who is special needs and they are seeking another consult to see if they can come up with a diagnosis.  She is very emotional about his situation. No UA symptoms.  Patient's last menstrual period was 12/21/2013.          Sexually active: yes  The current method of family planning is OCP (estrogen/progesterone).    Exercising: yes  Gym/ health club routine includes cardio and walking on track . Smoker:  no  Health Maintenance: Pap:  11/28/2011 Normal With neg HR HPV TDaP:  10/2008 Labs: HB: 13.2  Urine:  2+ leuk's, trace RBC (menses stopped 2 days ago)   reports that she has never smoked. She has never used smokeless tobacco. She reports that she drinks about 1.2 ounces of alcohol per week. She reports that she does not use illicit drugs.  Past Medical History  Diagnosis Date  . Lichen sclerosus et atrophicus of the vulva 12/2007    Past Surgical History  Procedure Laterality Date  . Vaginal delivery      x2  . Wisdom tooth extraction Bilateral 2002    Current Outpatient Prescriptions  Medication Sig Dispense Refill  . clobetasol ointment (TEMOVATE) 0.05 % Use small amount 2 times day for flare then only prn  30 g  2  . Loratadine (CLARITIN PO) Take by mouth as needed.      . Multiple Vitamins-Minerals (MULTIVITAMIN PO) Take by mouth daily.      . Norgestimate-Ethinyl Estradiol Triphasic (ORTHO TRI-CYCLEN LO) 0.18/0.215/0.25 MG-25 MCG tab Take 1 tablet by mouth daily.  3 Package  3   No current facility-administered medications for this visit.    Family History  Problem Relation Age of Onset  . Osteoarthritis Father   . Seizures Father   . Asthma Brother   . Heart failure Paternal Grandfather   . Breast cancer Paternal  Grandmother   . Heart failure Paternal Grandmother   . Cancer Paternal Grandmother     pancreatic  . Diabetes Paternal Grandmother   . Cancer Paternal Aunt     melanoma    ROS:  Pertinent items are noted in HPI.  Otherwise, a comprehensive ROS was negative.  Exam:   BP 118/62  Pulse 68  Ht 5' 7.5" (1.715 m)  Wt 138 lb (62.596 kg)  BMI 21.28 kg/m2  LMP 12/21/2013 Height: 5' 7.5" (171.5 cm)  Ht Readings from Last 3 Encounters:  12/29/13 5' 7.5" (1.715 m)  02/14/13 5\' 7"  (1.702 m)  11/28/12 5\' 7"  (1.702 m)    General appearance: alert, cooperative and appears stated age, emotional when discussing her son. Head: Normocephalic, without obvious abnormality, atraumatic Neck: no adenopathy, supple, symmetrical, trachea midline and thyroid normal to inspection and palpation Lungs: clear to auscultation bilaterally Breasts: normal appearance, no masses or tenderness Heart: regular rate and rhythm Abdomen: soft, non-tender; no masses,  no organomegaly Extremities: extremities normal, atraumatic, no cyanosis or edema Skin: Skin color, texture, turgor normal. No rashes or lesions Lymph nodes: Cervical, supraclavicular, and axillary nodes normal. No abnormal inguinal nodes palpated Neurologic: Grossly normal   Pelvic: External genitalia:  no lesions, LSA without flare today  Urethra:  normal appearing urethra with no masses, tenderness or lesions              Bartholin's and Skene's: normal                 Vagina: normal appearing vagina with normal color and discharge, no lesions              Cervix: anteverted              Pap taken: no Bimanual Exam:  Uterus:  normal size, contour, position, consistency, mobility, non-tender              Adnexa: no mass, fullness, tenderness               Rectovaginal: Confirms               Anus:  normal sphincter tone, no lesions  A:  Well Woman with normal exam  Contraception  History of LSA  Special needs child  P:    Reviewed health and wellness pertinent to exam  Pap smear not taken today  Refill on Ortho Lo for a year  Refill on Temovate prn use only for LSA  Counseled on breast self exam, adequate intake of calcium and vitamin D, diet and exercise return annually or prn  An After Visit Summary was printed and given to the patient.

## 2013-12-30 LAB — HEMOGLOBIN, FINGERSTICK: Hemoglobin, fingerstick: 13.9 g/dL (ref 12.0–16.0)

## 2014-01-05 NOTE — Progress Notes (Signed)
Encounter reviewed by Dr. Brook Silva.  

## 2014-05-11 ENCOUNTER — Encounter: Payer: Self-pay | Admitting: Nurse Practitioner

## 2014-09-04 ENCOUNTER — Other Ambulatory Visit: Payer: Self-pay | Admitting: *Deleted

## 2014-09-04 NOTE — Telephone Encounter (Signed)
[facility]  Medication refill request: Otho Tri-Cyclen Last AEX:  12/29/13 PG Next AEX: 12/31/14 Last MMG (if hormonal medication request): None Refill authorized: 12/29/13 #3packs/3R. Fax states last refill was picked today. Any authorized refills will be placed on file until pt due for next refill. Today #3packs/0R?

## 2014-09-07 MED ORDER — NORGESTIM-ETH ESTRAD TRIPHASIC 0.18/0.215/0.25 MG-25 MCG PO TABS: 1.0000 | ORAL_TABLET | Freq: Every day | ORAL | Status: DC

## 2014-12-08 ENCOUNTER — Other Ambulatory Visit: Payer: Self-pay | Admitting: Nurse Practitioner

## 2014-12-08 NOTE — Telephone Encounter (Signed)
Medication refill request: Tri-Lo-Sprintec. Patient is on Ortho Lo Tri-Cyclen  Last AEX:  11/28/13 PG Next AEX: 12/31/14 PG Last MMG (if hormonal medication request): None Refill authorized: 09/07/14 #1pack w/0R. Today #1pack w/0R?

## 2014-12-31 ENCOUNTER — Ambulatory Visit (INDEPENDENT_AMBULATORY_CARE_PROVIDER_SITE_OTHER): Payer: 59 | Admitting: Nurse Practitioner

## 2014-12-31 ENCOUNTER — Encounter: Payer: Self-pay | Admitting: Nurse Practitioner

## 2014-12-31 VITALS — BP 90/66 | HR 70 | Ht 67.5 in | Wt 139.6 lb

## 2014-12-31 DIAGNOSIS — Z0000 Encounter for general adult medical examination without abnormal findings: Secondary | ICD-10-CM

## 2014-12-31 DIAGNOSIS — Z01419 Encounter for gynecological examination (general) (routine) without abnormal findings: Secondary | ICD-10-CM | POA: Diagnosis not present

## 2014-12-31 MED ORDER — NORGESTIM-ETH ESTRAD TRIPHASIC 0.18/0.215/0.25 MG-25 MCG PO TABS: 1.0000 | ORAL_TABLET | Freq: Every day | ORAL | Status: DC

## 2014-12-31 MED ORDER — CLOBETASOL PROPIONATE 0.05 % EX OINT: TOPICAL_OINTMENT | CUTANEOUS | Status: DC

## 2014-12-31 NOTE — Progress Notes (Signed)
Encounter reviewed by Dr. Brook Amundson C. Silva.  

## 2014-12-31 NOTE — Patient Instructions (Signed)

## 2014-12-31 NOTE — Progress Notes (Signed)
39 y.o. G2P2 Married  Caucasian Fe here for annual exam.  Menses now at 3-4 days. Now on generic Ortho tri cyclen Lo.   Patient's last menstrual period was 12/16/2014.          Sexually active: Yes.    The current method of family planning is OCP (estrogen/progesterone).    Exercising: Yes.    aerobics, walking 2x/wk Smoker:  no  Health Maintenance: Pap: 11/28/2011 normal with Neg HR HPV TDaP:  10/2008 Labs: Hgb; 13.3 ; Urine; Declined   reports that she has never smoked. She has never used smokeless tobacco. She reports that she drinks about 2.4 oz of alcohol per week. She reports that she does not use illicit drugs.  Past Medical History  Diagnosis Date  . Lichen sclerosus et atrophicus of the vulva 12/2007    Past Surgical History  Procedure Laterality Date  . Vaginal delivery      x2  . Wisdom tooth extraction Bilateral 2002    Current Outpatient Prescriptions  Medication Sig Dispense Refill  . clobetasol ointment (TEMOVATE) 0.05 % Use small amount 2 times day for flare then only prn 30 g 4  . Loratadine (CLARITIN PO) Take by mouth as needed.    . Multiple Vitamins-Minerals (MULTIVITAMIN PO) Take by mouth daily.    . Norgestimate-Ethinyl Estradiol Triphasic (TRI-LO-SPRINTEC) 0.18/0.215/0.25 MG-25 MCG tab Take 1 tablet by mouth daily. 3 Package 4   No current facility-administered medications for this visit.    Family History  Problem Relation Age of Onset  . Osteoarthritis Father   . Seizures Father   . Asthma Brother   . Heart failure Paternal Grandfather   . Breast cancer Paternal Grandmother   . Heart failure Paternal Grandmother   . Cancer Paternal Grandmother     pancreatic  . Diabetes Paternal Grandmother   . Cancer Paternal Aunt     melanoma    ROS:  Pertinent items are noted in HPI.  Otherwise, a comprehensive ROS was negative.  Exam:   BP 90/66 mmHg  Pulse 70  Ht 5' 7.5" (1.715 m)  Wt 139 lb 9.6 oz (63.322 kg)  BMI 21.53 kg/m2  LMP 12/16/2014  Height: 5' 7.5" (171.5 cm) Ht Readings from Last 3 Encounters:  12/31/14 5' 7.5" (1.715 m)  12/29/13 5' 7.5" (1.715 m)  02/14/13  (1.702 m)    General appearance: alert, cooperative and appears stated age Head: Normocephalic, without obvious abnormality, atraumatic Neck: no adenopathy, supple, symmetrical, trachea midline and thyroid normal to inspection and palpation Lungs: clear to auscultation bilaterally Breasts: normal appearance, no masses or tenderness Heart: regular rate and rhythm Abdomen: soft, non-tender; no masses,  no organomegaly Extremities: extremities normal, atraumatic, no cyanosis or edema Skin: Skin color, texture, turgor normal. No rashes or lesions Lymph nodes: Cervical, supraclavicular, and axillary nodes normal. No abnormal inguinal nodes palpated Neurologic: Grossly normal   Pelvic: External genitalia:  no lesions              Urethra:  normal appearing urethra with no masses, tenderness or lesions              Bartholin's and Skene's: normal                 Vagina: normal appearing vagina with normal color and discharge, no lesions              Cervix: anteverted              Pap taken:  Yes.   Bimanual Exam:  Uterus:  normal size, contour, position, consistency, mobility, non-tender              Adnexa: no mass, fullness, tenderness               Rectovaginal: Confirms               Anus:  normal sphincter tone, no lesions  Chaperone present: Yes/NO  A:  Well Woman with normal exam  Contraception History of LSA Special needs child  P:   Reviewed health and wellness pertinent to exam  Pap smear as above  Refill OCP for a year  Counseled on breast self exam, use and side effects of OCP's, adequate intake of calcium and vitamin D, diet and exercise return annually or prn  An After Visit Summary was printed and given to the patient.

## 2015-01-01 LAB — HEMOGLOBIN, FINGERSTICK: Hemoglobin, fingerstick: 13.3 g/dL (ref 12.0–16.0)

## 2015-01-05 LAB — IPS PAP TEST WITH HPV

## 2015-04-22 ENCOUNTER — Telehealth: Payer: Self-pay | Admitting: Nurse Practitioner

## 2015-04-22 ENCOUNTER — Ambulatory Visit (INDEPENDENT_AMBULATORY_CARE_PROVIDER_SITE_OTHER): Payer: 59 | Admitting: Obstetrics and Gynecology

## 2015-04-22 ENCOUNTER — Encounter: Payer: Self-pay | Admitting: Obstetrics and Gynecology

## 2015-04-22 VITALS — BP 112/70 | HR 64 | Resp 16 | Wt 143.0 lb

## 2015-04-22 DIAGNOSIS — B373 Candidiasis of vulva and vagina: Secondary | ICD-10-CM

## 2015-04-22 DIAGNOSIS — B3731 Acute candidiasis of vulva and vagina: Secondary | ICD-10-CM

## 2015-04-22 MED ORDER — FLUCONAZOLE 150 MG PO TABS: 150.0000 mg | ORAL_TABLET | Freq: Once | ORAL | Status: DC

## 2015-04-22 NOTE — Telephone Encounter (Signed)
Patient calling with vaginal irritation that started this week. It typically comes and goes, she has used clobetasol ointment that has not helped and she is concerned. Patient is willing to be seen today if possible. Best contact #: [phone]

## 2015-04-22 NOTE — Telephone Encounter (Signed)
Called patient. History of LSA of Vulva. Clobetsol not improving symptoms. Office visit with Dr. Oscar LaJertson today at 1245. Patient agreeable. Routing to provider for final review. Patient agreeable to disposition. Will close encounter.

## 2015-04-22 NOTE — Progress Notes (Addendum)
GYNECOLOGY  VISIT   HPI: 39 y.o.   Married  Caucasian  female   G2P2 with Patient's last menstrual period was 04/06/2015.   here for vulvar pruritus. She has a h/o lichen sclerosis, flares every few months with some itching, tightness, uses steroids ointment prn. This current episode of itching has been constant x 3 day, no help with steroids. She went off the pill 6 weeks, she has noticed more vaginal d/c since then, no change is last few days. No odor.    GYNECOLOGIC HISTORY: Patient's last menstrual period was 04/06/2015. Contraception:none-stopped OCP x one month, considering Trying to conceive Menopausal hormone therapy: none        OB History    Gravida Para Term Preterm AB TAB SAB Ectopic Multiple Living   Patient Active Problem List   Diagnosis Date Noted  . Lichen sclerosus et atrophicus of the vulva 12/29/2013    Past Medical History  Diagnosis Date  . Lichen sclerosus et atrophicus of the vulva 12/2007    Past Surgical History  Procedure Laterality Date  . Vaginal delivery      x2  . Wisdom tooth extraction Bilateral 2002    Current Outpatient Prescriptions  Medication Sig Dispense Refill  . clobetasol ointment (TEMOVATE) 0.05 % Use small amount 2 times day for flare then only prn 30 g 4  . Multiple Vitamins-Minerals (MULTIVITAMIN PO) Take by mouth daily.    . fluconazole (DIFLUCAN) 150 MG tablet Take 1 tablet (150 mg total) by mouth once. Take one tablet.  Repeat in 72 hours if symptoms are not completely resolved. 2 tablet 0  . Norgestimate-Ethinyl Estradiol Triphasic (TRI-LO-SPRINTEC) 0.18/0.215/0.25 MG-25 MCG tab Take 1 tablet by mouth daily. (Patient not taking: Reported on 04/22/2015) 3 Package 4   No current facility-administered medications for this visit.     ALLERGIES: Review of patient's allergies indicates no known allergies.  Family History  Problem Relation Age of Onset  . Osteoarthritis Father   . Seizures Father   .  Asthma Brother   . Heart failure Paternal Grandfather   . Breast cancer Paternal Grandmother   . Heart failure Paternal Grandmother   . Cancer Paternal Grandmother     pancreatic  . Diabetes Paternal Grandmother   . Cancer Paternal Aunt     melanoma    Social History   Social History  . Marital Status: Married    Spouse Name: N/A  . Number of Children: N/A  . Years of Education: N/A   Occupational History  . Not on file.   Social History Main Topics  . Smoking status: Never Smoker   . Smokeless tobacco: Never Used  . Alcohol Use: 2.4 oz/week    4 Glasses of wine per week     Comment: occ wine or beer  . Drug Use: No  . Sexual Activity:    Partners: Male    Birth Control/ Protection: Pill   Other Topics Concern  . Not on file   Social History Narrative    ROS  PHYSICAL EXAMINATION:    BP 112/70 mmHg  Pulse 64  Resp 16  Wt 143 lb (64.864 kg)  LMP 04/06/2015    General appearance: alert, cooperative and appears stated age  Pelvic: External genitalia:  Some loss of architecture of the labia minors, slight agglutination of the clitoris to the labia majora. Slight whitening over the clitoris.  Erythema noted on the inner labia minora.               Urethra:  normal appearing urethra with no masses, tenderness or lesions              Bartholins and Skenes: normal                 Vagina: normal appearing vagina an increase in thick whitish vagina d/c              Cervix: no lesions  Chaperone was present for exam.  Wet prep: no clue, no trich, +++WBC KOH: +yeast PH: 4  ASSESSMENT Yeast vaginitis  PLAN Treat with diflucan, she can use her steroid ointment for the next few days as well   An After Visit Summary was printed and given to the patient.     Addendum: comprehensive ROS was negative other than vulvar irritation.

## 2015-06-14 LAB — OB RESULTS CONSOLE HEPATITIS B SURFACE ANTIGEN: HEP B S AG: NEGATIVE

## 2015-06-14 LAB — OB RESULTS CONSOLE RUBELLA ANTIBODY, IGM: Rubella: IMMUNE

## 2015-06-14 LAB — OB RESULTS CONSOLE HIV ANTIBODY (ROUTINE TESTING): HIV: NONREACTIVE

## 2015-06-14 LAB — OB RESULTS CONSOLE GC/CHLAMYDIA
CHLAMYDIA, DNA PROBE: NEGATIVE
Gonorrhea: NEGATIVE

## 2015-07-11 NOTE — L&D Delivery Note (Signed)
Delivery Note  First Stage: Labor onset: 1200 - latent labor onset Augmentation : none Analgesia /Anesthesia intrapartum: epidural @ 2100 AROM at 1740 active labor onset  Second Stage: Complete dilation at 2254 Onset of pushing at 2300 FHR second stage category2  Delivery of a viable female at 2321 by CNM in ROA position loose nuchal cord X1 with additional wrap right foot x 1 Cord double clamped after cessation of pulsation, cut by FOB  Collection of cord blood donation done Cord blood sample collected   Third Stage: [facility] 3 VC @ 2327 Placenta disposition: disposal Uterine tone firm / bleeding small  1st laceration identified along previous scar  Anesthesia for repair: epdural Repair 3-0 support suture at previous scarring with 4-0 subcuticular  Est. Blood Loss (mL): 100  Complications: none  Mom to postpartum.  Baby to Couplet care / Skin to Skin.  Newborn: Birth Weight: 7 pounds 7.6 ounces Apgar Scores: 8-9 Feeding planned: breast  [name], Victoria Mcintyre CNM, MSN, FACNM 12/28/2015, 11:49 PM

## 2015-10-19 LAB — OB RESULTS CONSOLE RPR: RPR: NONREACTIVE

## 2015-12-14 LAB — OB RESULTS CONSOLE GBS: GBS: NEGATIVE

## 2015-12-28 ENCOUNTER — Encounter (HOSPITAL_COMMUNITY): Payer: Self-pay

## 2015-12-28 ENCOUNTER — Inpatient Hospital Stay (HOSPITAL_COMMUNITY)
Admission: AD | Admit: 2015-12-28 | Discharge: 2015-12-30 | DRG: 775 | Disposition: A | Discharge status: Home / Self Care | Payer: 59 | Source: Ambulatory Visit | Attending: Obstetrics and Gynecology | Admitting: Obstetrics and Gynecology

## 2015-12-28 ENCOUNTER — Inpatient Hospital Stay (HOSPITAL_COMMUNITY): Payer: 59 | Admitting: Anesthesiology

## 2015-12-28 DIAGNOSIS — Z8759 Personal history of other complications of pregnancy, childbirth and the puerperium: Secondary | ICD-10-CM

## 2015-12-28 DIAGNOSIS — Z825 Family history of asthma and other chronic lower respiratory diseases: Secondary | ICD-10-CM | POA: Diagnosis not present

## 2015-12-28 DIAGNOSIS — Z803 Family history of malignant neoplasm of breast: Secondary | ICD-10-CM

## 2015-12-28 DIAGNOSIS — Z3A38 38 weeks gestation of pregnancy: Secondary | ICD-10-CM | POA: Diagnosis not present

## 2015-12-28 DIAGNOSIS — Z808 Family history of malignant neoplasm of other organs or systems: Secondary | ICD-10-CM

## 2015-12-28 DIAGNOSIS — Z79899 Other long term (current) drug therapy: Secondary | ICD-10-CM | POA: Diagnosis not present

## 2015-12-28 DIAGNOSIS — Z8249 Family history of ischemic heart disease and other diseases of the circulatory system: Secondary | ICD-10-CM

## 2015-12-28 DIAGNOSIS — Z9889 Other specified postprocedural states: Secondary | ICD-10-CM | POA: Diagnosis not present

## 2015-12-28 DIAGNOSIS — Z833 Family history of diabetes mellitus: Secondary | ICD-10-CM

## 2015-12-28 LAB — CBC
HCT: 32.9 % — ABNORMAL LOW (ref 36.0–46.0)
Hemoglobin: 11 g/dL — ABNORMAL LOW (ref 12.0–15.0)
MCH: 29.3 pg (ref 26.0–34.0)
MCHC: 33.4 g/dL (ref 30.0–36.0)
MCV: 87.7 fL (ref 78.0–100.0)
Platelets: 220 10*3/uL (ref 150–400)
RBC: 3.75 MIL/uL — ABNORMAL LOW (ref 3.87–5.11)
RDW: 13.6 % (ref 11.5–15.5)
WBC: 11.8 10*3/uL — ABNORMAL HIGH (ref 4.0–10.5)

## 2015-12-28 LAB — ABO/RH: ABO/RH(D): O POS

## 2015-12-28 LAB — TYPE AND SCREEN
ABO/RH(D): O POS
Antibody Screen: NEGATIVE

## 2015-12-28 MED ORDER — FENTANYL 2.5 MCG/ML BUPIVACAINE 1/10 % EPIDURAL INFUSION (WH - ANES)
14.0000 mL/h | INTRAMUSCULAR | Status: DC | PRN
  Administered 2015-12-28: 14 mL/h via EPIDURAL
  Filled 2015-12-28: qty 125

## 2015-12-28 MED ORDER — LIDOCAINE HCL (PF) 1 % IJ SOLN
INTRAMUSCULAR | Status: DC | PRN
  Administered 2015-12-28: 4 mL
  Administered 2015-12-28: 6 mL via EPIDURAL

## 2015-12-28 MED ORDER — EPHEDRINE 5 MG/ML INJ
10.0000 mg | INTRAVENOUS | Status: DC | PRN
  Filled 2015-12-28: qty 2

## 2015-12-28 MED ORDER — OXYCODONE-ACETAMINOPHEN 5-325 MG PO TABS: 1.0000 | ORAL_TABLET | ORAL | Status: DC | PRN

## 2015-12-28 MED ORDER — LIDOCAINE HCL (PF) 1 % IJ SOLN
30.0000 mL | INTRAMUSCULAR | Status: DC | PRN
  Filled 2015-12-28: qty 30

## 2015-12-28 MED ORDER — OXYTOCIN BOLUS FROM INFUSION: 500.0000 mL | INTRAVENOUS | Status: DC

## 2015-12-28 MED ORDER — PHENYLEPHRINE 40 MCG/ML (10ML) SYRINGE FOR IV PUSH (FOR BLOOD PRESSURE SUPPORT)
80.0000 ug | PREFILLED_SYRINGE | INTRAVENOUS | Status: AC | PRN
  Administered 2015-12-28 (×3): 80 ug via INTRAVENOUS

## 2015-12-28 MED ORDER — PHENYLEPHRINE 40 MCG/ML (10ML) SYRINGE FOR IV PUSH (FOR BLOOD PRESSURE SUPPORT)
80.0000 ug | PREFILLED_SYRINGE | INTRAVENOUS | Status: DC | PRN
  Administered 2015-12-28: 80 ug via INTRAVENOUS
  Filled 2015-12-28: qty 10
  Filled 2015-12-28: qty 5

## 2015-12-28 MED ORDER — LACTATED RINGERS IV SOLN: 500.0000 mL | Freq: Once | INTRAVENOUS | Status: DC

## 2015-12-28 MED ORDER — OXYTOCIN 40 UNITS IN LACTATED RINGERS INFUSION - SIMPLE MED
2.5000 [IU]/h | INTRAVENOUS | Status: DC
  Filled 2015-12-28: qty 1000

## 2015-12-28 MED ORDER — OXYCODONE-ACETAMINOPHEN 5-325 MG PO TABS: 2.0000 | ORAL_TABLET | ORAL | Status: DC | PRN

## 2015-12-28 MED ORDER — ACETAMINOPHEN 325 MG PO TABS
650.0000 mg | ORAL_TABLET | ORAL | Status: DC | PRN
  Filled 2015-12-28: qty 2

## 2015-12-28 MED ORDER — LACTATED RINGERS IV SOLN: 500.0000 mL | INTRAVENOUS | Status: DC | PRN

## 2015-12-28 MED ORDER — SOD CITRATE-CITRIC ACID 500-334 MG/5ML PO SOLN: 30.0000 mL | ORAL | Status: DC | PRN

## 2015-12-28 MED ORDER — LACTATED RINGERS IV SOLN
INTRAVENOUS | Status: DC
  Administered 2015-12-28: 16:00:00 via INTRAVENOUS

## 2015-12-28 MED ORDER — DIPHENHYDRAMINE HCL 50 MG/ML IJ SOLN: 12.5000 mg | INTRAMUSCULAR | Status: DC | PRN

## 2015-12-28 MED ORDER — ONDANSETRON HCL 4 MG/2ML IJ SOLN
4.0000 mg | Freq: Four times a day (QID) | INTRAMUSCULAR | Status: DC
Start: 2015-12-28 — End: 2015-12-29
  Administered 2015-12-28: 4 mg via INTRAVENOUS
  Filled 2015-12-28: qty 2

## 2015-12-28 NOTE — Anesthesia Pain Management Evaluation Note (Signed)
  CRNA Pain Management Visit Note  Patient: Victoria Mcintyre, 40 y.o., female  "Hello I am a [facility] of the [facility]. We have an anesthesia team available at all times to provide care throughout the hospital, including epidural management and anesthesia for C-section. I don't know your plan for the delivery whether it a natural birth, water birth, IV sedation, nitrous supplementation, doula or epidural, but we want to meet your pain goals."   1.Was your pain managed to your expectations on prior hospitalizations?   Yes   2.What is your expectation for pain management during this hospitalization?     Epidural  3.How can we help you reach that goal? Epidural  Record the patient's initial score and the patient's pain goal.   Pain: 2  Pain Goal: 7 [facility] wants you to be able to say your pain was always managed very well.  Cephus ShellingBURGER,Victoria Mcintyre 12/28/2015

## 2015-12-28 NOTE — H&P (Signed)
  OB ADMISSION/ HISTORY & PHYSICAL:  Admission Date: 12/28/2015  2:22 PM  Admit Diagnosis: 38.3 weeks / latent labor / hx precipitous labor  Victoria Mcintyre is a 40 y.o. female presenting for latent labor with cervical change in office 3-4cm with blood show.  Prenatal History: G3P2   EDC : 01/08/2016, by Other Basis  [facility]  Primary Ob Provider: Marlinda Mikeanya Keaunna Skipper CNM Prenatal course complicated by hx rapid labor  (<4hr) / child with CP  Prenatal Labs: ABO, Rh: --/--/O POS (06/20 1558) Antibody: NEG (06/20 1558) Rubella: Immune (12/05 0000)  RPR: Nonreactive (04/11 0000)  HBsAg: Negative (12/05 0000)  HIV: Non-reactive (12/05 0000)  GTT: normal GBS: Negative (06/06 0000)   Medical / Surgical History :  Past medical history:  Past Medical History  Diagnosis Date  . Lichen sclerosus et atrophicus of the vulva 12/2007    Past surgical history:  Past Surgical History  Procedure Laterality Date  . Vaginal delivery      x2  . Wisdom tooth extraction Bilateral 2002   Family History:  Family History  Problem Relation Age of Onset  . Osteoarthritis Father   . Seizures Father   . Asthma Brother   . Heart failure Paternal Grandfather   . Breast cancer Paternal Grandmother   . Heart failure Paternal Grandmother   . Cancer Paternal Grandmother     pancreatic  . Diabetes Paternal Grandmother   . Cancer Paternal Aunt     melanoma    Social History:  reports that she has never smoked. She has never used smokeless tobacco. She reports that she drinks about 2.4 oz of alcohol per week. She reports that she does not use illicit drugs.  Allergies: Review of patient's allergies indicates no known allergies.   Current Medications at time of admission:  Prior to Admission medications   Medication Sig Start Date End Date Taking? Authorizing Provider  clobetasol ointment (TEMOVATE) 0.05 % Use small amount 2 times day for flare then only prn 12/31/14  Yes  Ria CommentPatricia Grubb, FNP  Docusate Calcium (STOOL SOFTENER PO) Take 2 tablets by mouth as needed.   Yes Historical Provider, MD  Prenatal Vit-Fe Fumarate-FA (MULTIVITAMIN-PRENATAL) 27-0.8 MG TABS tablet Take 1 tablet by mouth daily at 12 noon.   Yes Historical Provider, MD  ranitidine (ZANTAC) 75 MG tablet Take 75 mg by mouth 2 (two) times daily.   Yes Historical Provider, MD   Review of Systems: Active FM Irregular ctx since early am No LOF bloody show +  Physical Exam:  VS: Blood pressure 136/66, pulse 86, temperature 97.7 F (36.5 C), temperature source Oral, resp. rate 18, height 5\' 7"  (1.702 m), weight 82.555 kg (182 lb), last menstrual period 04/06/2015.  General: alert and oriented, appears comfortable Heart: RRR Lungs: Clear lung fields Abdomen: Gravid, soft and non-tender, non-distended / uterus: gravid Extremities: no edema  Genitalia / VE: 4-5cm / 80% / vtx -1 BBOW with show FHR: baseline rate 140 / variability moderate / accelerations + / no decelerations TOCO: Q6-8  Assessment: 38.[redacted] weeks gestation latent stage of labor FHR category 1   Plan:  Admit AROM Expectant management with early epidural    [name], Joleigh Mineau CNM, MSN, FACNM 12/28/2015, 7:46 PM

## 2015-12-28 NOTE — Progress Notes (Signed)
S:  Ctx irregular  O:  VS: Blood pressure 136/66, pulse 86, temperature 98.5 F (36.9 C), temperature source Oral, resp. rate 18, height 5\' 7"  (1.702 m), weight 82.555 kg (182 lb), last menstrual period 04/06/2015.        FHR : baseline 145 / variability moderate / accelerations + / none decelerations        Toco: contractions every 6-10 minutes / mild         Cervix : 4-5 / 80% / vtx -2 BBOW        Membranes: AROM - clear  A:  labor     FHR category 1  P: expectant management     Epidural early - hx rapid labor progression to birth   [name], Gibson Lad CNM, MSN, [facility] 12/28/2015, 6:27 PM

## 2015-12-28 NOTE — Anesthesia Preprocedure Evaluation (Signed)

## 2015-12-28 NOTE — Anesthesia Procedure Notes (Signed)
Epidural Patient location during procedure: OB  Preanesthetic Checklist Completed: patient identified, site marked, surgical consent, pre-op evaluation, timeout performed, IV checked, risks and benefits discussed and monitors and equipment checked  Epidural Patient position: sitting Prep: site prepped and draped and DuraPrep Patient monitoring: continuous pulse ox and blood pressure Approach: midline Location: L3-L4 Injection technique: LOR air  Needle:  Needle type: Tuohy  Needle gauge: 17 G Needle length: 9 cm and 9 Needle insertion depth: 4 cm Catheter type: closed end flexible Catheter size: 19 Gauge Catheter at skin depth: 10 cm Test dose: negative  Assessment Events: blood not aspirated, injection not painful, no injection resistance, negative IV test and no paresthesia  Additional Notes Dosing of Epidural:  1st dose, through catheter .............................................  Xylocaine 40 mg  2nd dose, through catheter, after waiting 3 minutes.........Xylocaine 60 mg    As each dose occurred, patient was free of IV sx; and patient exhibited no evidence of SA injection.  Patient is more comfortable after epidural dosed. Please see RN's note for documentation of vital signs,and FHR which are stable.  Patient reminded not to try to ambulate with numb legs, and that an RN must be present when she attempts to get up.       

## 2015-12-29 ENCOUNTER — Encounter (HOSPITAL_COMMUNITY): Payer: Self-pay

## 2015-12-29 LAB — RPR: RPR Ser Ql: NONREACTIVE

## 2015-12-29 MED ORDER — HYDROCORTISONE 2.5 % RE CREA
TOPICAL_CREAM | Freq: Three times a day (TID) | RECTAL | Status: DC
  Administered 2015-12-29 (×2): via RECTAL
  Filled 2015-12-29: qty 28.35

## 2015-12-29 MED ORDER — COCONUT OIL OIL: 1.0000 "application " | TOPICAL_OIL | Status: DC | PRN

## 2015-12-29 MED ORDER — HYDROCORTISONE ACE-PRAMOXINE 2.5-1 % RE CREA
TOPICAL_CREAM | Freq: Three times a day (TID) | RECTAL | Status: DC
Start: 2015-12-29 — End: 2015-12-29
  Filled 2015-12-29: qty 30

## 2015-12-29 MED ORDER — DOCUSATE SODIUM 100 MG PO CAPS
200.0000 mg | ORAL_CAPSULE | Freq: Every day | ORAL | Status: DC
Start: 2015-12-29 — End: 2015-12-30
  Administered 2015-12-29 – 2015-12-30 (×2): 200 mg via ORAL
  Filled 2015-12-29 (×3): qty 2

## 2015-12-29 MED ORDER — ACETAMINOPHEN 325 MG PO TABS: 650.0000 mg | ORAL_TABLET | ORAL | Status: DC | PRN

## 2015-12-29 MED ORDER — SENNOSIDES-DOCUSATE SODIUM 8.6-50 MG PO TABS
2.0000 | ORAL_TABLET | ORAL | Status: DC
  Administered 2015-12-29 (×2): 2 via ORAL
  Filled 2015-12-29 (×2): qty 2

## 2015-12-29 MED ORDER — SIMETHICONE 80 MG PO CHEW
80.0000 mg | CHEWABLE_TABLET | ORAL | Status: DC | PRN
  Administered 2015-12-29: 80 mg via ORAL
  Filled 2015-12-29: qty 1

## 2015-12-29 MED ORDER — WITCH HAZEL-GLYCERIN EX PADS
1.0000 "application " | MEDICATED_PAD | CUTANEOUS | Status: DC | PRN
  Administered 2015-12-29: 1 via TOPICAL

## 2015-12-29 MED ORDER — IBUPROFEN 600 MG PO TABS
600.0000 mg | ORAL_TABLET | Freq: Four times a day (QID) | ORAL | Status: DC
  Administered 2015-12-29 – 2015-12-30 (×6): 600 mg via ORAL
  Filled 2015-12-29 (×7): qty 1

## 2015-12-29 MED ORDER — BENZOCAINE-MENTHOL 20-0.5 % EX AERO
1.0000 "application " | INHALATION_SPRAY | CUTANEOUS | Status: DC | PRN
  Administered 2015-12-29 – 2015-12-30 (×2): 1 via TOPICAL
  Filled 2015-12-29 (×2): qty 56

## 2015-12-29 MED ORDER — DIBUCAINE 1 % RE OINT: 1.0000 "application " | TOPICAL_OINTMENT | RECTAL | Status: DC | PRN

## 2015-12-29 NOTE — Anesthesia Postprocedure Evaluation (Signed)
Anesthesia Post Note  Patient: Victoria Mcintyre  Procedure(s) Performed: * No procedures listed *  Patient location during evaluation: Mother Baby Anesthesia Type: Epidural Level of consciousness: awake, awake and alert, oriented and patient cooperative Pain management: pain level controlled Vital Signs Assessment: post-procedure vital signs reviewed and stable Respiratory status: spontaneous breathing, nonlabored ventilation and respiratory function stable Cardiovascular status: stable Postop Assessment: no headache, no backache, patient able to bend at knees and no signs of nausea or vomiting Anesthetic complications: no     Last Vitals:  Filed Vitals:   12/29/15 0215 12/29/15 0626  BP: 108/53 107/54  Pulse: 71 73  Temp: 36.1 C 36.7 C  Resp: 18 18    Last Pain:  Filed Vitals:   12/29/15 0819  PainSc: Asleep   Pain Goal: Patients Stated Pain Goal: 7 (12/28/15 1503)               Brooks Stotz L

## 2015-12-29 NOTE — Lactation Note (Signed)
This note was copied from a baby's chart. Lactation Consultation Note Attempted visit at 22 hours of age.  Mom and baby resting.  Mom reports next feeding should be around 12.  [facility]  Patient Name: Boy Rudi CocoCassidy Rauf Today's Date: 12/29/2015     Maternal Data    Feeding Feeding Type: Breast Fed Length of feed: 15 min  LATCH Score/Interventions Latch: Repeated attempts needed to sustain latch, nipple held in mouth throughout feeding, stimulation needed to elicit sucking reflex. Intervention(s): Adjust position  Audible Swallowing: Spontaneous and intermittent  Type of Nipple: Everted at rest and after stimulation  Comfort (Breast/Nipple): Soft / non-tender     Hold (Positioning): No assistance needed to correctly position infant at breast.  LATCH Score: 9  Lactation Tools Discussed/Used     Consult Status      Jaymason Ledesma, Arvella MerlesJana Lynn 12/29/2015, 10:08 PM

## 2015-12-29 NOTE — Progress Notes (Signed)
Patient ID: Victoria Mcintyre, female   DOB: 04/13/1976, 40 y.o.   MRN: [id number] PPD # 1  Subjective: Pt reports feeling tired, but well / Pain controlled with motrin Tolerating po/  Has voided since delivery without problems /  No n/v Bleeding is moderate Newborn info:  Information for the patient's newborn:  Victoria Mcintyre, Boy Victoria Mcintyre [[id number]][[id number]]  female  / circ desired; will notify MD on call / Feeding: breast   Objective:  VS: Blood pressure 107/54, pulse 73, temperature 98 F (36.7 C), temperature source Oral, resp. rate 18.    Recent Labs  12/28/15 1558  WBC 11.8*  HGB 11.0*  HCT 32.9*  PLT 220    Blood type: O Pos Rubella: Immune    Physical Exam:  General: alert, cooperative and no distress CV: Regular rate and rhythm Resp: clear Abdomen: soft, nontender, normal bowel sounds Uterine Fundus: firm, below umbilicus, nontender Perineum: not inspected Ext: no edema, redness or tenderness in the calves or thighs   A/P: PPD # 1/ G3 P 3 0 0 3/ S/P:SVD w/1st deg lac Doing well Continue routine post partum orders Anticipate D/C home tomorrow    Demetrius RevelFISHER,Rea Reser K, MSN, [facility] 12/29/2015, 9:52 AM

## 2015-12-30 MED ORDER — IBUPROFEN 600 MG PO TABS: 600.0000 mg | ORAL_TABLET | Freq: Four times a day (QID) | ORAL | Status: DC | PRN

## 2015-12-30 NOTE — Progress Notes (Signed)
Patient ID: Victoria Mcintyre, female   DOB: 09/28/1975, 40 y.o.   MRN: [id number] PPD # 2  Subjective: Pt reports feeling well and eager for d/c home / Pain controlled with ibuprofen Tolerating po/ Voiding without problems/ No n/v Bleeding is light/ Newborn info:  Information for the patient's newborn:  Victoria Mcintyre, Victoria Mcintyre [[id number]][[id number]]  female  / circ desired; to be performed prior to d/c home / Feeding: breast    Objective:  VS: Blood pressure 114/70, pulse 60, temperature 97.2 F (36.2 C), temperature source Axillary, resp. rate 16.    Recent Labs  12/28/15 1558  WBC 11.8*  HGB 11.0*  HCT 32.9*  PLT 220    Blood type: O POS, Rubella: Immune   Physical Exam:  General:  alert, cooperative and no distress CV: Regular rate and rhythm Resp: clear Abdomen: soft, nontender, normal bowel sounds Uterine Fundus: firm, below umbilicus, nontender Perineum: healing with good reapproximation and mild edema and erythema Lochia: minimal  Rectum: Small hemorrhoid, <1cm, pink, fleshy Ext: Homans sign is negative, no sign of DVT and trace pedal edema    A/P: PPD # 2/ G3P1001/ S/P: SVD w/ 1st deg lac Hx Severe Atrophic syndrome; add estrace pp Hemorrhoids; stable and soft <1cm at present Doing well and stable for discharge home RX: Ibuprofen 600mg  po Q 6 hrs prn pain #30 Refill x 1 Cont stool softener at home WOB/GYN booklet given Routine pp visit in 6wks   Demetrius RevelFISHER,Amarisa Wilinski K, MSN, [facility] 12/30/2015, 8:27 AM

## 2015-12-30 NOTE — Lactation Note (Signed)
This note was copied from a baby's chart. Lactation Consultation Note Baby had 6% weight loss at 24 hrs old. Cluster feeding, 9 voids and 3 stools. LC feels that weight loss may be from out put. Latch score 9. Patient Name: Victoria Mcintyre [id number]'YToday's Date: 12/30/2015 Reason for consult: Follow-up assessment;Infant weight loss   Maternal Data    Feeding Feeding Type: Breast Fed Length of feed: 15 min  LATCH Score/Interventions Latch: Grasps breast easily, tongue down, lips flanged, rhythmical sucking.  Audible Swallowing: A few with stimulation  Type of Nipple: Everted at rest and after stimulation  Comfort (Breast/Nipple): Soft / non-tender     Hold (Positioning): No assistance needed to correctly position infant at breast.  LATCH Score: 9  Lactation Tools Discussed/Used     Consult Status Consult Status: Follow-up Date: 12/30/15 (in pm) Follow-up type: In-patient    Cymone Yeske, Diamond NickelLAURA G 12/30/2015, 2:13 AM

## 2015-12-30 NOTE — Lactation Note (Signed)
This note was copied from a baby's chart. Lactation Consultation Note: Experienced BF mom has baby latched to breast when I went into room. Just wanted to make sure latch looked good before going home. Reports baby fed a lot through the night and nipples are a little tender. I untucked bottom lip and mom reports that feels better. Encouraged to rub EBM into nipples after nursing. Baby for circ this morning. Reviewed normal behavior after circ. Reviewed OP appointments and BFSG as resources for support after DC. To call prn   Patient Name: Victoria Mcintyre UJWJX'BToday's Date: 12/30/2015 Reason for consult: Follow-up assessment   Maternal Data Formula Feeding for Exclusion: Yes Reason for exclusion: Mother's choice to formula and breast feed on admission Has patient been taught Hand Expression?: Yes Does the patient have breastfeeding experience prior to this delivery?: Yes  Feeding Feeding Type: Breast Fed Length of feed: 15 min  LATCH Score/Interventions Latch: Grasps breast easily, tongue down, lips flanged, rhythmical sucking. Intervention(s): Adjust position  Audible Swallowing: A few with stimulation Intervention(s): Hand expression  Type of Nipple: Everted at rest and after stimulation  Comfort (Breast/Nipple): Filling, red/small blisters or bruises, mild/mod discomfort  Problem noted: Mild/Moderate discomfort Interventions (Mild/moderate discomfort): Hand expression  Hold (Positioning): No assistance needed to correctly position infant at breast.  LATCH Score: 8  Lactation Tools Discussed/Used     Consult Status Consult Status: Complete    Pamelia HoitWeeks, Kyal Arts D 12/30/2015, 9:26 AM

## 2015-12-30 NOTE — Discharge Summary (Signed)
OB Discharge Summary  Patient Name: Victoria Mcintyre DOB: 04/25/1976 MRN: [id number]  Date of admission: 12/28/2015  Pt is a G3 P2 0 0 2 at 7517w3d. Admitting diagnosis: LABOR Secondary diagnosis: None   Discharge diagnosis: Term Pregnancy Delivered       Hx severe atrophic syndrome.  Will send rx for Estrace.  Date of discharge: 12/30/2015        Prenatal history: G3P1001   EDC : 01/08/2016, by Other Basis  [facility]  Primary provider : CNM Prenatal course uncomplicated  Prenatal Labs: ABO, Rh: --/--/O POS, O POS (06/20 1558) Antibody: NEG (06/20 1558) Rubella: Immune (12/05 0000) RPR: Non Reactive (06/20 1558)  HBsAg: Negative (12/05 0000)  HIV: Non-reactive (12/05 0000)  GBS: Negative (06/06 0000)                                    Hospital course:  Onset of Labor With Vaginal Delivery     40 y.o. yo G3P2 0 0 2 at 3317w3d was admitted in Latent Labor on 12/28/2015. Patient had an uncomplicated labor course as follows:  Membrane Rupture Time/Date: 5:40 PM ,12/28/2015   Intrapartum Procedures: Episiotomy: None [1]                                         Lacerations:  1st degree [2]  Patient had a delivery of a Viable infant. 12/28/2015  Information for the patient's newborn:  Alfonse AlpersMackay, Boy Meerab [[id number]][[id number]]  Delivery Method: Vaginal, Spontaneous Delivery (Filed from Delivery Summary)    Pateint had an uncomplicated postpartum course.  She is ambulating, tolerating a regular diet, passing flatus, and urinating well. Patient is discharged home in stable condition on 12/30/2015.   Augmentation: None Delivering PROVIDER: [name], TANYA                                                            Complications: None  Newborn Data: Live born female  Birth Weight: 7 lb 7.6 oz (3391 g) APGAR: 8, 9  Baby Feeding: Breast Disposition:home with mother  Post partum procedures:none    Labs: Lab Results  Component Value Date   WBC 11.8*  12/28/2015   HGB 11.0* 12/28/2015   HCT 32.9* 12/28/2015   MCV 87.7 12/28/2015   PLT 220 12/28/2015     Physical Exam @ time of discharge:  Filed Vitals:   12/29/15 0626 12/29/15 1430 12/29/15 1900 12/30/15 0636  BP: 107/54 112/60 109/54 114/70  Pulse: 73 69 61 60  Temp: 98 F (36.7 C) 97.7 F (36.5 C) 98.1 F (36.7 C) 97.2 F (36.2 C)  TempSrc: Oral Oral Oral Axillary  Resp: 18 18 14 16   Height:      Weight:      SpO2: 98%       General: alert, cooperative and no distress Lochia: appropriate Uterine Fundus: firm Perineum: intact, reapproximating well; mild erythema; small hemorrhoid Incision: N/A Extremities: No evidence of DVT seen on physical exam.   No flowsheet data found. Discharge Medications:    Medication List  TAKE these medications        clobetasol ointment 0.05 %  Commonly known as:  TEMOVATE  Use small amount 2 times day for flare then only prn     ibuprofen 600 MG tablet  Commonly known as:  ADVIL,MOTRIN  Take 1 tablet (600 mg total) by mouth every 6 (six) hours as needed.     multivitamin-prenatal 27-0.8 MG Tabs tablet  Take 1 tablet by mouth daily at 12 noon.     ranitidine 75 MG tablet  Commonly known as:  ZANTAC  Take 75 mg by mouth 2 (two) times daily.     STOOL SOFTENER PO  Take 2 tablets by mouth as needed.         Discharge instructions:  "Baby and Me Booklet" and [facility] Booklet  Diet: routine diet  Activity: Advance as tolerated. Pelvic rest x 6 weeks.   Follow up:6 weeks    Demetrius RevelFISHER,Elenna Spratling K, MSN, [facility] 12/30/2015, 9:13 AM

## 2016-01-04 ENCOUNTER — Ambulatory Visit: Payer: 59 | Admitting: Nurse Practitioner

## 2016-06-28 ENCOUNTER — Telehealth: Payer: Self-pay | Admitting: Pediatrics

## 2016-06-28 NOTE — Telephone Encounter (Signed)
Opened in error

## 2018-05-21 ENCOUNTER — Encounter: Payer: Self-pay | Admitting: Obstetrics & Gynecology

## 2019-04-14 ENCOUNTER — Other Ambulatory Visit: Payer: Self-pay

## 2019-04-14 DIAGNOSIS — Z20822 Contact with and (suspected) exposure to covid-19: Secondary | ICD-10-CM

## 2019-04-16 LAB — NOVEL CORONAVIRUS, NAA: SARS-CoV-2, NAA: NOT DETECTED

## 2019-07-23 ENCOUNTER — Other Ambulatory Visit: Payer: 59

## 2019-07-24 DIAGNOSIS — Z634 Disappearance and death of family member: Secondary | ICD-10-CM | POA: Insufficient documentation

## 2019-07-24 DIAGNOSIS — F4321 Adjustment disorder with depressed mood: Secondary | ICD-10-CM | POA: Insufficient documentation

## 2019-07-24 DIAGNOSIS — F419 Anxiety disorder, unspecified: Secondary | ICD-10-CM | POA: Insufficient documentation

## 2019-09-30 ENCOUNTER — Encounter: Payer: 59 | Admitting: Obstetrics and Gynecology

## 2019-11-03 ENCOUNTER — Ambulatory Visit: Payer: 59 | Admitting: Obstetrics & Gynecology

## 2019-11-03 ENCOUNTER — Other Ambulatory Visit: Payer: Self-pay

## 2019-11-03 ENCOUNTER — Encounter: Payer: Self-pay | Admitting: Obstetrics & Gynecology

## 2019-11-03 VITALS — BP 110/68 | HR 68 | Temp 97.2°F | Resp 16 | Ht 68.5 in | Wt 175.0 lb

## 2019-11-03 DIAGNOSIS — Z01419 Encounter for gynecological examination (general) (routine) without abnormal findings: Secondary | ICD-10-CM | POA: Diagnosis not present

## 2019-11-03 NOTE — Progress Notes (Addendum)
44 y.o. G58P1001 Married White or Caucasian female here for annual exam.  She is a former patient.  Has been followed by [facility] for the past year.  Middle son who had a chromosomal abnormality passed in January.  She's on Zoloft and has been on this for 50mg .  She is in therapy.  She and her husband are going together.  Oldest is 37 1/2 and youngest is 3 1/2.  Lab work was done 09/28/2019.    Patient's last menstrual period was 10/15/2019 (exact date).          Sexually active: Yes.    The current method of family planning is OCP (estrogen/progesterone).    Exercising: Yes.    3-4 times a week Smoker:  no  Health Maintenance: Pap:  12-31-14 neg HPV HR neg, 11/19 neg per patient at [facility] (outside records show neg pap with neg HR HPV 05/21/2018) History of abnormal Pap:  no MMG:  08-21-2019 birads 2:neg (care everywhere) TDaP:  UTD (2017 she thinks) Screening Labs: 09/2019   reports that she has never smoked. She has never used smokeless tobacco. She reports current alcohol use of about 4.0 standard drinks of alcohol per week. She reports that she does not use drugs.  Past Medical History:  Diagnosis Date  . Anxiety   . Depression   . Lichen sclerosus et atrophicus of the vulva 12/2007    Past Surgical History:  Procedure Laterality Date  . WISDOM TOOTH EXTRACTION Bilateral 2002    Current Outpatient Medications  Medication Sig Dispense Refill  . clobetasol ointment (TEMOVATE) 0.05 % Use small amount 2 times day for flare then only prn 30 g 4  . levonorgestrel-ethinyl estradiol (ALESSE) 0.1-20 MG-MCG tablet Take by mouth.    . loratadine (CLARITIN) 10 MG tablet     . sertraline (ZOLOFT) 50 MG tablet Take by mouth.     No current facility-administered medications for this visit.    Family History  Problem Relation Age of Onset  . Osteoarthritis Father   . Heart disease Father   . Seizures Father   . Autoimmune disease Father   . Asthma Brother   . Heart failure  Paternal Grandfather   . Breast cancer Paternal Grandmother   . Heart failure Paternal Grandmother   . Cancer Paternal Grandmother        pancreatic  . Diabetes Paternal Grandmother   . Cancer Paternal Aunt        melanoma    Review of Systems  Constitutional: Negative.   HENT: Negative.   Eyes: Negative.   Respiratory: Negative.   Cardiovascular: Negative.   Gastrointestinal: Negative.   Endocrine: Negative.   Genitourinary: Negative.   Musculoskeletal: Negative.   Skin: Negative.   Allergic/Immunologic: Negative.   Neurological: Negative.   Psychiatric/Behavioral: Negative.     Exam:   BP 110/68   Pulse 68   Temp (!) 97.2 F (36.2 C) (Skin)   Resp 16   Ht 5' 8.5" (1.74 m)   Wt 175 lb (79.4 kg)   LMP 10/15/2019 (Exact Date)   BMI 26.22 kg/m   Height: 5' 8.5" (174 cm)  General appearance: alert, cooperative and appears stated age Head: Normocephalic, without obvious abnormality, atraumatic Neck: no adenopathy, supple, symmetrical, trachea midline and thyroid normal to inspection and palpation Lungs: clear to auscultation bilaterally Breasts: normal appearance, no masses or tenderness Heart: regular rate and rhythm Abdomen: soft, non-tender; bowel sounds normal; no masses,  no organomegaly Extremities: extremities normal,  atraumatic, no cyanosis or edema Skin: Skin color, texture, turgor normal. No rashes or lesions Lymph nodes: Cervical, supraclavicular, and axillary nodes normal. No abnormal inguinal nodes palpated Neurologic: Grossly normal   Pelvic: External genitalia:  no lesions              Urethra:  normal appearing urethra with no masses, tenderness or lesions              Bartholins and Skenes: normal                 Vagina: normal appearing vagina with normal color and discharge, no lesions              Cervix: no lesions               Pap taken: Pap obtained but will wait for outside records prior to placing order Bimanual Exam:  Uterus:  normal  size, contour, position, consistency, mobility, non-tender              Adnexa: no mass, fullness, tenderness               Rectovaginal: Confirms               Anus:  normal sphincter tone, no lesions  Chaperone, Terence Lux, CMA, was present for exam.  A:  Well Woman with normal exam H/o lichen sclerosus Loss of child earlier this year On OCPs for contraception Decreased libido  P:   Mammogram guidelines reviewed.  Doing yearly pap smear obtained today but will await outside records (records showed neg pap with neg HR HPV 05/21/2018 so pap discarded and not sent) Rx for clobetasol 0.05% ointment bid for no more than 5 days.  #30g/0RF.  If using more than this monthly, consider maintenance therapy We discuss possibly changing treatment to Wellbutrin due to libido issues.  Will consider.   return annually or prn

## 2019-11-03 NOTE — Patient Instructions (Signed)
Lichen Sclerosus treatment:  Clobetasol 0.05% ointment BID for active areas.  Don't use for more than 5 days at a time.  Maintenance with triamcinolone 0.1% ointment twice weekly and estrogen cream as needed  Panty liners only as needed and the unscented type.  Dove or [employer].  Washing the vaginal/perianal area once daily with warm water only.  Try not to use anything with perfumes or anything that is scented.  Do not douche.  Do not use washcloths or any topical wipes.    It is ok to use topical vaseline to the area multiple times a day as needed

## 2019-11-06 MED ORDER — CLOBETASOL PROPIONATE 0.05 % EX OINT: TOPICAL_OINTMENT | CUTANEOUS | 1 refills | Status: DC

## 2019-12-31 ENCOUNTER — Other Ambulatory Visit: Payer: Self-pay | Admitting: Obstetrics & Gynecology

## 2019-12-31 NOTE — Telephone Encounter (Signed)
Tried calling patient regarding refill. No answer. Left message for patient to call me back.

## 2020-07-21 ENCOUNTER — Other Ambulatory Visit: Payer: 59

## 2020-07-21 DIAGNOSIS — Z20822 Contact with and (suspected) exposure to covid-19: Secondary | ICD-10-CM

## 2020-07-23 LAB — SARS-COV-2, NAA 2 DAY TAT

## 2020-07-23 LAB — NOVEL CORONAVIRUS, NAA: SARS-CoV-2, NAA: DETECTED — AB

## 2021-01-20 ENCOUNTER — Ambulatory Visit: Payer: 59

## 2021-03-11 ENCOUNTER — Ambulatory Visit (HOSPITAL_BASED_OUTPATIENT_CLINIC_OR_DEPARTMENT_OTHER): Payer: 59 | Admitting: Obstetrics & Gynecology

## 2021-04-27 ENCOUNTER — Other Ambulatory Visit: Payer: Self-pay

## 2021-04-27 ENCOUNTER — Ambulatory Visit (INDEPENDENT_AMBULATORY_CARE_PROVIDER_SITE_OTHER): Payer: 59 | Admitting: Obstetrics & Gynecology

## 2021-04-27 ENCOUNTER — Other Ambulatory Visit (HOSPITAL_COMMUNITY)
Admission: RE | Admit: 2021-04-27 | Discharge: 2021-04-27 | Disposition: A | Discharge status: Home / Self Care | Payer: 59 | Source: Ambulatory Visit | Attending: Obstetrics & Gynecology | Admitting: Obstetrics & Gynecology

## 2021-04-27 ENCOUNTER — Encounter (HOSPITAL_BASED_OUTPATIENT_CLINIC_OR_DEPARTMENT_OTHER): Payer: Self-pay | Admitting: Obstetrics & Gynecology

## 2021-04-27 VITALS — BP 117/64 | HR 62 | Ht 67.5 in | Wt 171.2 lb

## 2021-04-27 DIAGNOSIS — Z124 Encounter for screening for malignant neoplasm of cervix: Secondary | ICD-10-CM | POA: Diagnosis not present

## 2021-04-27 DIAGNOSIS — N763 Subacute and chronic vulvitis: Secondary | ICD-10-CM

## 2021-04-27 DIAGNOSIS — Z3041 Encounter for surveillance of contraceptive pills: Secondary | ICD-10-CM

## 2021-04-27 DIAGNOSIS — Z01419 Encounter for gynecological examination (general) (routine) without abnormal findings: Secondary | ICD-10-CM | POA: Diagnosis not present

## 2021-04-27 MED ORDER — LEVONORGESTREL-ETHINYL ESTRAD 0.1-20 MG-MCG PO TABS: 1.0000 | ORAL_TABLET | Freq: Every day | ORAL | 3 refills | Status: DC

## 2021-04-27 MED ORDER — CLOBETASOL PROPIONATE 0.05 % EX OINT: TOPICAL_OINTMENT | CUTANEOUS | 1 refills | Status: DC

## 2021-04-27 NOTE — Progress Notes (Addendum)
45 y.o. G61P1001 Married White or Caucasian female here for annual exam.  Doing well.  Went to [place] for vacation this summer.  Was fun for the family.  Discussed vulvar irritation.  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    Sees PA at Dr. Fortunato Curling office.  Zoloft was decreased to 50mg  dosage.    Patient's last menstrual period was 04/06/2021.          Sexually active: Yes.    The current method of family planning is OCP (estrogen/progesterone).    Exercising: Yes.     tennis Smoker:  no  Health Maintenance: Pap:  obtained today History of abnormal Pap:  no MMG:  09/2020 Colonoscopy:  discussed guidelines Screening Labs: done 09/2020   reports that she has never smoked. She has never used smokeless tobacco. She reports current alcohol use of about 4.0 standard drinks per week. She reports that she does not use drugs.  Past Medical History:  Diagnosis Date   Anxiety    Depression    Lichen sclerosus et atrophicus of the vulva 12/2007    Past Surgical History:  Procedure Laterality Date   WISDOM TOOTH EXTRACTION Bilateral 2002    Current Outpatient Medications  Medication Sig Dispense Refill   clobetasol ointment (TEMOVATE) 0.05 % Use small amount 2 times day for flare.  Do not use for more than 5 days each month. 30 g 1   levonorgestrel-ethinyl estradiol (ALESSE) 0.1-20 MG-MCG tablet Take by mouth.     loratadine (CLARITIN) 10 MG tablet      sertraline (ZOLOFT) 50 MG tablet Take by mouth.     No current facility-administered medications for this visit.    Family History  Problem Relation Age of Onset   Osteoarthritis Father    Heart disease Father    Seizures Father    Autoimmune disease Father    Asthma Brother    Heart failure Paternal Grandfather    Breast cancer Paternal Grandmother    Heart failure Paternal Grandmother    Cancer Paternal Grandmother        pancreatic   Diabetes Paternal Grandmother    Cancer Paternal Aunt        melanoma     Review of Systems  All other systems reviewed and are negative.  Exam:   BP 117/64 (BP Location: Left Arm, Patient Position: Sitting, Cuff Size: Large)   Pulse 62   Ht 5' 7.5" (1.715 m)   Wt 171 lb 3.2 oz (77.7 kg)   LMP 04/06/2021   BMI 26.42 kg/m   Height: 5' 7.5" (171.5 cm)  General appearance: alert, cooperative and appears stated age Head: Normocephalic, without obvious abnormality, atraumatic Neck: no adenopathy, supple, symmetrical, trachea midline and thyroid normal to inspection and palpation Lungs: clear to auscultation bilaterally Breasts: normal appearance, no masses or tenderness Heart: regular rate and rhythm Abdomen: soft, non-tender; bowel sounds normal; no masses,  no organomegaly Extremities: extremities normal, atraumatic, no cyanosis or edema Skin: Skin color, texture, turgor normal. No rashes or lesions Lymph nodes: Cervical, supraclavicular, and axillary nodes normal. No abnormal inguinal nodes palpated Neurologic: Grossly normal   Pelvic: External genitalia:  no lesions              Urethra:  normal appearing urethra with no masses, tenderness or lesions              Bartholins and Skenes: normal  Vagina: normal appearing vagina with normal color and no discharge, no lesions              Cervix: no lesions              Pap taken: Yes.   Bimanual Exam:  Uterus:  normal size, contour, position, consistency, mobility, non-tender              Adnexa: normal adnexa and no mass, fullness, tenderness               Rectovaginal: Confirms               Anus:  normal sphincter tone, no lesions  Chaperone, Ina Homes, CMA, was present for exam.  Assessment/Plan: 1. Well woman exam with routine gynecological exam - pap smear obtained today - MMG up to date and in Care Everyhwere - colon cancer screening discussed - lab work done 09/2020 - vaccines updated  2. Encounter for surveillance of contraceptive pills - rx for OCPs to  pharmacy  3. Chronic vulvitis - possible causes discussed.  She is going to try and see if any of ideas we discussed seem to be related. - she is also going to start using aquaphor for barrier and less clobetasol topically - RX for clobetasol to pharmacy.  Pt knows to call if using for more than 5 days each month.  Would use lower potency steroid to decrease possibility for skin thinning.

## 2021-04-29 LAB — CYTOLOGY - PAP
Comment: NEGATIVE
Diagnosis: NEGATIVE
High risk HPV: NEGATIVE

## 2021-06-03 ENCOUNTER — Other Ambulatory Visit (HOSPITAL_BASED_OUTPATIENT_CLINIC_OR_DEPARTMENT_OTHER): Payer: Self-pay | Admitting: Obstetrics & Gynecology

## 2021-06-08 NOTE — Telephone Encounter (Signed)
LMOVM for pt to call office, question regarding refill request and drug usage

## 2022-04-17 ENCOUNTER — Emergency Department (HOSPITAL_COMMUNITY)
Admission: EM | Admit: 2022-04-17 | Discharge: 2022-04-18 | Discharge status: Against Medical Advice | Payer: 59 | Source: Home / Self Care

## 2022-04-17 ENCOUNTER — Encounter (HOSPITAL_COMMUNITY): Payer: Self-pay | Admitting: *Deleted

## 2022-04-17 ENCOUNTER — Other Ambulatory Visit: Payer: Self-pay

## 2022-04-17 ENCOUNTER — Emergency Department (HOSPITAL_COMMUNITY): Payer: 59

## 2022-04-17 DIAGNOSIS — R109 Unspecified abdominal pain: Secondary | ICD-10-CM | POA: Insufficient documentation

## 2022-04-17 DIAGNOSIS — R1110 Vomiting, unspecified: Secondary | ICD-10-CM | POA: Insufficient documentation

## 2022-04-17 DIAGNOSIS — Z5321 Procedure and treatment not carried out due to patient leaving prior to being seen by health care provider: Secondary | ICD-10-CM | POA: Insufficient documentation

## 2022-04-17 DIAGNOSIS — K3532 Acute appendicitis with perforation and localized peritonitis, without abscess: Secondary | ICD-10-CM | POA: Diagnosis not present

## 2022-04-17 DIAGNOSIS — K3533 Acute appendicitis with perforation and localized peritonitis, with abscess: Secondary | ICD-10-CM | POA: Diagnosis not present

## 2022-04-17 LAB — I-STAT BETA HCG BLOOD, ED (MC, WL, AP ONLY): I-stat hCG, quantitative: <5 m[IU]/mL (ref <5)

## 2022-04-17 LAB — COMPREHENSIVE METABOLIC PANEL
ALT: 22 U/L (ref 0–44)
AST: 25 U/L (ref 15–41)
Albumin: 3.4 g/dL — ABNORMAL LOW (ref 3.5–5.0)
Alkaline Phosphatase: 68 U/L (ref 38–126)
Anion gap: 13 (ref 5–15)
BUN: 6 mg/dL (ref 6–20)
CO2: 22 mmol/L (ref 22–32)
Calcium: 8.8 mg/dL — ABNORMAL LOW (ref 8.9–10.3)
Chloride: 97 mmol/L — ABNORMAL LOW (ref 98–111)
Creatinine, Ser: 0.86 mg/dL (ref 0.44–1.00)
GFR, Estimated: >60 mL/min (ref >60)
Glucose, Bld: 141 mg/dL — ABNORMAL HIGH (ref 70–99)
Potassium: 3.4 mmol/L — ABNORMAL LOW (ref 3.5–5.1)
Sodium: 132 mmol/L — ABNORMAL LOW (ref 135–145)
Total Bilirubin: 0.9 mg/dL (ref 0.3–1.2)
Total Protein: 6.7 g/dL (ref 6.5–8.1)

## 2022-04-17 LAB — CBC
HCT: 45 % (ref 36.0–46.0)
Hemoglobin: 15.2 g/dL — ABNORMAL HIGH (ref 12.0–15.0)
MCH: 30.5 pg (ref 26.0–34.0)
MCHC: 33.8 g/dL (ref 30.0–36.0)
MCV: 90.2 fL (ref 80.0–100.0)
Platelets: 211 10*3/uL (ref 150–400)
RBC: 4.99 MIL/uL (ref 3.87–5.11)
RDW: 12.2 % (ref 11.5–15.5)
WBC: 12 10*3/uL — ABNORMAL HIGH (ref 4.0–10.5)
nRBC: 0 % (ref 0.0–0.2)

## 2022-04-17 LAB — LIPASE, BLOOD: Lipase: 24 U/L (ref 11–51)

## 2022-04-17 MED ORDER — OXYCODONE HCL 5 MG PO TABS
5.0000 mg | ORAL_TABLET | Freq: Once | ORAL | Status: AC
  Administered 2022-04-17: 5 mg via ORAL
  Filled 2022-04-17: qty 1

## 2022-04-17 NOTE — ED Provider Triage Note (Signed)
Emergency Medicine Provider Triage Evaluation Note  Victoria Mcintyre , a 46 y.o. female  was evaluated in triage.  Pt complains of abdominal pain x 3 days. Started with pain and significant vomiting that she thought was food poisoning. Symptoms were improving until the past 3 hours in which patient has had severe pain and cramping. Some nausea, tried zofran which made the cramping worse. States the pain is worse than child labor.   Review of Systems  Positive: Abd pain, nausea Negative: Diarrhea, fever  Physical Exam  BP 106/61 (BP Location: Left Arm)   Pulse 90   Temp 99 F (37.2 C) (Oral)   Resp (!) 22   SpO2 100%  Gen:   Awake, no distress   Resp:  Normal effort  MSK:   Moves extremities without difficulty  Other:    Medical Decision Making  Medically screening exam initiated at 8:53 PM.  Appropriate orders placed.  Victoria Mcintyre was informed that the remainder of the evaluation will be completed by another provider, this initial triage assessment does not replace that evaluation, and the importance of remaining in the ED until their evaluation is complete.  Workup initiated   [name], PA-C 04/17/22 2055

## 2022-04-17 NOTE — ED Notes (Signed)
Pt appears to have increased pain when transferring from chair to w/c. Pt has been given pain medication. Patient educated about not driving or performing other critical tasks (such as operating heavy machinery, caring for infant/toddler/child) due to sedative nature of narcotic medications received while in the ED.  Pt/caregiver verbalized understanding.aware to notify staff of changes while in the lobby.

## 2022-04-17 NOTE — ED Triage Notes (Addendum)
Pt reporting cramping and vomiting on Friday night, improved yesterday, onset on severe abdominal cramping today. Denies fevers. Zofran 4mg  taken around 1700 today.

## 2022-04-18 ENCOUNTER — Emergency Department (HOSPITAL_COMMUNITY): Payer: 59

## 2022-04-18 ENCOUNTER — Other Ambulatory Visit: Payer: Self-pay

## 2022-04-18 ENCOUNTER — Encounter (HOSPITAL_COMMUNITY): Payer: Self-pay

## 2022-04-18 ENCOUNTER — Encounter (HOSPITAL_COMMUNITY): Admission: EM | Disposition: A | Discharge status: Home / Self Care | Payer: Self-pay | Source: Home / Self Care

## 2022-04-18 ENCOUNTER — Inpatient Hospital Stay (HOSPITAL_COMMUNITY)
Admission: EM | Admit: 2022-04-18 | Discharge: 2022-04-25 | DRG: 398 | Disposition: A | Discharge status: Home / Self Care | Payer: 59 | Attending: General Surgery | Admitting: General Surgery

## 2022-04-18 ENCOUNTER — Observation Stay (HOSPITAL_COMMUNITY): Payer: 59 | Admitting: Anesthesiology

## 2022-04-18 ENCOUNTER — Observation Stay (HOSPITAL_BASED_OUTPATIENT_CLINIC_OR_DEPARTMENT_OTHER): Payer: 59 | Admitting: Anesthesiology

## 2022-04-18 DIAGNOSIS — Y733 Surgical instruments, materials and gastroenterology and urology devices (including sutures) associated with adverse incidents: Secondary | ICD-10-CM | POA: Diagnosis not present

## 2022-04-18 DIAGNOSIS — F32A Depression, unspecified: Secondary | ICD-10-CM | POA: Diagnosis present

## 2022-04-18 DIAGNOSIS — Y92239 Unspecified place in hospital as the place of occurrence of the external cause: Secondary | ICD-10-CM | POA: Diagnosis not present

## 2022-04-18 DIAGNOSIS — K567 Ileus, unspecified: Secondary | ICD-10-CM | POA: Diagnosis not present

## 2022-04-18 DIAGNOSIS — K3533 Acute appendicitis with perforation and localized peritonitis, with abscess: Principal | ICD-10-CM | POA: Diagnosis present

## 2022-04-18 DIAGNOSIS — Z825 Family history of asthma and other chronic lower respiratory diseases: Secondary | ICD-10-CM

## 2022-04-18 DIAGNOSIS — K3532 Acute appendicitis with perforation and localized peritonitis, without abscess: Principal | ICD-10-CM

## 2022-04-18 DIAGNOSIS — K3580 Unspecified acute appendicitis: Secondary | ICD-10-CM

## 2022-04-18 DIAGNOSIS — Z833 Family history of diabetes mellitus: Secondary | ICD-10-CM

## 2022-04-18 DIAGNOSIS — Y838 Other surgical procedures as the cause of abnormal reaction of the patient, or of later complication, without mention of misadventure at the time of the procedure: Secondary | ICD-10-CM | POA: Diagnosis not present

## 2022-04-18 DIAGNOSIS — E878 Other disorders of electrolyte and fluid balance, not elsewhere classified: Secondary | ICD-10-CM | POA: Diagnosis present

## 2022-04-18 DIAGNOSIS — E876 Hypokalemia: Secondary | ICD-10-CM | POA: Diagnosis present

## 2022-04-18 DIAGNOSIS — E871 Hypo-osmolality and hyponatremia: Secondary | ICD-10-CM | POA: Diagnosis present

## 2022-04-18 DIAGNOSIS — K9189 Other postprocedural complications and disorders of digestive system: Secondary | ICD-10-CM | POA: Diagnosis not present

## 2022-04-18 DIAGNOSIS — R739 Hyperglycemia, unspecified: Secondary | ICD-10-CM | POA: Diagnosis present

## 2022-04-18 DIAGNOSIS — Z808 Family history of malignant neoplasm of other organs or systems: Secondary | ICD-10-CM

## 2022-04-18 DIAGNOSIS — Z79899 Other long term (current) drug therapy: Secondary | ICD-10-CM

## 2022-04-18 DIAGNOSIS — F419 Anxiety disorder, unspecified: Secondary | ICD-10-CM | POA: Diagnosis present

## 2022-04-18 DIAGNOSIS — Z8249 Family history of ischemic heart disease and other diseases of the circulatory system: Secondary | ICD-10-CM

## 2022-04-18 HISTORY — PX: LAPAROSCOPIC APPENDECTOMY: SHX408

## 2022-04-18 HISTORY — DX: Family history of other specified conditions: Z84.89

## 2022-04-18 LAB — URINALYSIS, ROUTINE W REFLEX MICROSCOPIC
Bilirubin Urine: NEGATIVE
Glucose, UA: NEGATIVE mg/dL
Ketones, ur: 20 mg/dL — AB
Nitrite: NEGATIVE
Protein, ur: 100 mg/dL — AB
Specific Gravity, Urine: >1.046 — ABNORMAL HIGH (ref 1.005–1.030)
pH: 5 (ref 5.0–8.0)

## 2022-04-18 LAB — HIV ANTIBODY (ROUTINE TESTING W REFLEX): HIV Screen 4th Generation wRfx: NONREACTIVE

## 2022-04-18 LAB — BASIC METABOLIC PANEL
Anion gap: 9 (ref 5–15)
BUN: 7 mg/dL (ref 6–20)
CO2: 21 mmol/L — ABNORMAL LOW (ref 22–32)
Calcium: 7.9 mg/dL — ABNORMAL LOW (ref 8.9–10.3)
Chloride: 104 mmol/L (ref 98–111)
Creatinine, Ser: 0.75 mg/dL (ref 0.44–1.00)
GFR, Estimated: >60 mL/min (ref >60)
Glucose, Bld: 102 mg/dL — ABNORMAL HIGH (ref 70–99)
Potassium: 3.6 mmol/L (ref 3.5–5.1)
Sodium: 134 mmol/L — ABNORMAL LOW (ref 135–145)

## 2022-04-18 LAB — SURGICAL PCR SCREEN
MRSA, PCR: NEGATIVE
Staphylococcus aureus: NEGATIVE

## 2022-04-18 SURGERY — APPENDECTOMY, LAPAROSCOPIC
Anesthesia: General | Site: Abdomen

## 2022-04-18 MED ORDER — SCOPOLAMINE 1 MG/3DAYS TD PT72
1.0000 | MEDICATED_PATCH | TRANSDERMAL | Status: DC
  Administered 2022-04-18: 1.5 mg via TRANSDERMAL

## 2022-04-18 MED ORDER — ROCURONIUM BROMIDE 10 MG/ML (PF) SYRINGE
PREFILLED_SYRINGE | INTRAVENOUS | Status: AC
  Filled 2022-04-18: qty 10

## 2022-04-18 MED ORDER — SERTRALINE HCL 25 MG PO TABS
25.0000 mg | ORAL_TABLET | Freq: Every day | ORAL | Status: DC
  Administered 2022-04-18 – 2022-04-25 (×7): 25 mg via ORAL
  Filled 2022-04-18 (×8): qty 1

## 2022-04-18 MED ORDER — FENTANYL CITRATE (PF) 100 MCG/2ML IJ SOLN
INTRAMUSCULAR | Status: AC
  Filled 2022-04-18: qty 2

## 2022-04-18 MED ORDER — SODIUM CHLORIDE 0.9 % IV BOLUS (SEPSIS)
1000.0000 mL | Freq: Once | INTRAVENOUS | Status: AC
  Administered 2022-04-18: 1000 mL via INTRAVENOUS

## 2022-04-18 MED ORDER — SENNA 8.6 MG PO TABS
1.0000 | ORAL_TABLET | Freq: Two times a day (BID) | ORAL | Status: DC
  Administered 2022-04-18 – 2022-04-24 (×10): 8.6 mg via ORAL
  Filled 2022-04-18 (×11): qty 1

## 2022-04-18 MED ORDER — SIMETHICONE 80 MG PO CHEW
40.0000 mg | CHEWABLE_TABLET | Freq: Four times a day (QID) | ORAL | Status: DC | PRN
  Administered 2022-04-19: 40 mg via ORAL
  Filled 2022-04-18: qty 1

## 2022-04-18 MED ORDER — MORPHINE SULFATE (PF) 4 MG/ML IV SOLN
4.0000 mg | INTRAVENOUS | Status: DC | PRN
  Administered 2022-04-18: 4 mg via INTRAVENOUS
  Filled 2022-04-18: qty 1

## 2022-04-18 MED ORDER — ONDANSETRON HCL 4 MG/2ML IJ SOLN
INTRAMUSCULAR | Status: AC
  Filled 2022-04-18: qty 2

## 2022-04-18 MED ORDER — PIPERACILLIN-TAZOBACTAM 3.375 G IVPB: 3.3750 g | Freq: Three times a day (TID) | INTRAVENOUS | Status: DC

## 2022-04-18 MED ORDER — HYDROMORPHONE HCL 1 MG/ML IJ SOLN: 0.5000 mg | Freq: Once | INTRAMUSCULAR | Status: DC

## 2022-04-18 MED ORDER — SODIUM CHLORIDE 0.9 % IV BOLUS
1000.0000 mL | Freq: Once | INTRAVENOUS | Status: AC
  Administered 2022-04-18: 1000 mL via INTRAVENOUS

## 2022-04-18 MED ORDER — SODIUM CHLORIDE 0.9 % IV SOLN: INTRAVENOUS | Status: DC | PRN

## 2022-04-18 MED ORDER — PROPOFOL 10 MG/ML IV BOLUS
INTRAVENOUS | Status: AC
  Filled 2022-04-18: qty 20

## 2022-04-18 MED ORDER — LIDOCAINE HCL (PF) 2 % IJ SOLN
INTRAMUSCULAR | Status: AC
  Filled 2022-04-18: qty 5

## 2022-04-18 MED ORDER — DIPHENHYDRAMINE HCL 12.5 MG/5ML PO ELIX
12.5000 mg | ORAL_SOLUTION | Freq: Four times a day (QID) | ORAL | Status: DC | PRN
  Filled 2022-04-18: qty 5

## 2022-04-18 MED ORDER — DIPHENHYDRAMINE HCL 50 MG/ML IJ SOLN
12.5000 mg | Freq: Four times a day (QID) | INTRAMUSCULAR | Status: DC | PRN
  Administered 2022-04-23: 12.5 mg via INTRAVENOUS
  Filled 2022-04-18: qty 1

## 2022-04-18 MED ORDER — ONDANSETRON HCL 4 MG/2ML IJ SOLN: 4.0000 mg | Freq: Four times a day (QID) | INTRAMUSCULAR | Status: DC | PRN

## 2022-04-18 MED ORDER — FENTANYL CITRATE (PF) 250 MCG/5ML IJ SOLN
INTRAMUSCULAR | Status: AC
  Filled 2022-04-18: qty 5

## 2022-04-18 MED ORDER — KCL-LACTATED RINGERS-D5W 20 MEQ/L IV SOLN
INTRAVENOUS | Status: DC
  Filled 2022-04-18: qty 1000

## 2022-04-18 MED ORDER — MELATONIN 3 MG PO TABS
3.0000 mg | ORAL_TABLET | Freq: Every evening | ORAL | Status: DC | PRN
  Administered 2022-04-19 – 2022-04-21 (×2): 3 mg via ORAL
  Filled 2022-04-18 (×4): qty 1

## 2022-04-18 MED ORDER — PROCHLORPERAZINE EDISYLATE 10 MG/2ML IJ SOLN
10.0000 mg | Freq: Once | INTRAMUSCULAR | Status: AC
  Administered 2022-04-18: 10 mg via INTRAVENOUS
  Filled 2022-04-18: qty 2

## 2022-04-18 MED ORDER — MIDAZOLAM HCL 2 MG/2ML IJ SOLN
INTRAMUSCULAR | Status: DC | PRN
  Administered 2022-04-18: 2 mg via INTRAVENOUS

## 2022-04-18 MED ORDER — ONDANSETRON HCL 4 MG/2ML IJ SOLN
4.0000 mg | Freq: Four times a day (QID) | INTRAMUSCULAR | Status: DC | PRN
  Administered 2022-04-20 – 2022-04-23 (×6): 4 mg via INTRAVENOUS
  Filled 2022-04-18 (×6): qty 2

## 2022-04-18 MED ORDER — ACETAMINOPHEN 325 MG PO TABS
650.0000 mg | ORAL_TABLET | Freq: Four times a day (QID) | ORAL | Status: DC | PRN
  Administered 2022-04-18: 650 mg via ORAL
  Filled 2022-04-18: qty 2

## 2022-04-18 MED ORDER — MUPIROCIN 2 % EX OINT
1.0000 | TOPICAL_OINTMENT | Freq: Two times a day (BID) | CUTANEOUS | Status: DC
  Administered 2022-04-18 (×2): 1 via NASAL
  Filled 2022-04-18: qty 22

## 2022-04-18 MED ORDER — METHOCARBAMOL 500 MG PO TABS
500.0000 mg | ORAL_TABLET | Freq: Three times a day (TID) | ORAL | Status: DC | PRN
  Administered 2022-04-19 – 2022-04-22 (×4): 500 mg via ORAL
  Filled 2022-04-18 (×4): qty 1

## 2022-04-18 MED ORDER — MORPHINE SULFATE (PF) 2 MG/ML IV SOLN: 1.0000 mg | INTRAVENOUS | Status: DC | PRN

## 2022-04-18 MED ORDER — MORPHINE SULFATE (PF) 4 MG/ML IV SOLN
4.0000 mg | Freq: Once | INTRAVENOUS | Status: AC
  Administered 2022-04-18: 4 mg via INTRAVENOUS
  Filled 2022-04-18: qty 1

## 2022-04-18 MED ORDER — LIDOCAINE 2% (20 MG/ML) 5 ML SYRINGE
INTRAMUSCULAR | Status: DC | PRN
  Administered 2022-04-18: 60 mg via INTRAVENOUS

## 2022-04-18 MED ORDER — ACETAMINOPHEN 650 MG RE SUPP: 650.0000 mg | Freq: Four times a day (QID) | RECTAL | Status: DC | PRN

## 2022-04-18 MED ORDER — IOHEXOL 300 MG/ML  SOLN
100.0000 mL | Freq: Once | INTRAMUSCULAR | Status: AC | PRN
  Administered 2022-04-18: 100 mL via INTRAVENOUS

## 2022-04-18 MED ORDER — PROPOFOL 10 MG/ML IV BOLUS
INTRAVENOUS | Status: DC | PRN
  Administered 2022-04-18: 150 mg via INTRAVENOUS

## 2022-04-18 MED ORDER — ENOXAPARIN SODIUM 40 MG/0.4ML IJ SOSY
40.0000 mg | PREFILLED_SYRINGE | INTRAMUSCULAR | Status: DC
  Administered 2022-04-19 – 2022-04-25 (×7): 40 mg via SUBCUTANEOUS
  Filled 2022-04-18 (×7): qty 0.4

## 2022-04-18 MED ORDER — CLOBETASOL PROPIONATE 0.05 % EX OINT: TOPICAL_OINTMENT | Freq: Two times a day (BID) | CUTANEOUS | Status: DC | PRN

## 2022-04-18 MED ORDER — 0.9 % SODIUM CHLORIDE (POUR BTL) OPTIME
TOPICAL | Status: DC | PRN
  Administered 2022-04-18: 1000 mL

## 2022-04-18 MED ORDER — SODIUM CHLORIDE (PF) 0.9 % IJ SOLN
INTRAMUSCULAR | Status: AC
  Filled 2022-04-18: qty 50

## 2022-04-18 MED ORDER — SCOPOLAMINE 1 MG/3DAYS TD PT72
MEDICATED_PATCH | TRANSDERMAL | Status: AC
  Filled 2022-04-18: qty 1

## 2022-04-18 MED ORDER — SODIUM CHLORIDE 0.9 % IV SOLN
2.0000 g | INTRAVENOUS | Status: DC
  Administered 2022-04-18: 2 g via INTRAVENOUS
  Filled 2022-04-18: qty 20

## 2022-04-18 MED ORDER — MIDAZOLAM HCL 2 MG/2ML IJ SOLN
INTRAMUSCULAR | Status: AC
  Filled 2022-04-18: qty 2

## 2022-04-18 MED ORDER — SUGAMMADEX SODIUM 200 MG/2ML IV SOLN
INTRAVENOUS | Status: DC | PRN
  Administered 2022-04-18: 200 mg via INTRAVENOUS

## 2022-04-18 MED ORDER — BUPIVACAINE-EPINEPHRINE 0.25% -1:200000 IJ SOLN
INTRAMUSCULAR | Status: DC | PRN
  Administered 2022-04-18: 20 mL

## 2022-04-18 MED ORDER — MORPHINE SULFATE (PF) 2 MG/ML IV SOLN
2.0000 mg | INTRAVENOUS | Status: DC | PRN
  Filled 2022-04-18: qty 1

## 2022-04-18 MED ORDER — ONDANSETRON 4 MG PO TBDP
4.0000 mg | ORAL_TABLET | Freq: Four times a day (QID) | ORAL | Status: DC | PRN
  Administered 2022-04-19: 4 mg via ORAL
  Filled 2022-04-18: qty 1

## 2022-04-18 MED ORDER — PROCHLORPERAZINE EDISYLATE 10 MG/2ML IJ SOLN
5.0000 mg | Freq: Four times a day (QID) | INTRAMUSCULAR | Status: DC | PRN
  Administered 2022-04-19 – 2022-04-21 (×7): 10 mg via INTRAVENOUS
  Filled 2022-04-18 (×9): qty 2

## 2022-04-18 MED ORDER — ENOXAPARIN SODIUM 40 MG/0.4ML IJ SOSY: 40.0000 mg | PREFILLED_SYRINGE | INTRAMUSCULAR | Status: DC

## 2022-04-18 MED ORDER — FENTANYL CITRATE PF 50 MCG/ML IJ SOSY
25.0000 ug | PREFILLED_SYRINGE | INTRAMUSCULAR | Status: DC | PRN
  Administered 2022-04-18 (×2): 25 ug via INTRAVENOUS

## 2022-04-18 MED ORDER — FENTANYL CITRATE PF 50 MCG/ML IJ SOSY
PREFILLED_SYRINGE | INTRAMUSCULAR | Status: AC
  Filled 2022-04-18: qty 1

## 2022-04-18 MED ORDER — LACTATED RINGERS IV SOLN: INTRAVENOUS | Status: DC

## 2022-04-18 MED ORDER — LEVONORGESTREL-ETHINYL ESTRAD 0.1-20 MG-MCG PO TABS
1.0000 | ORAL_TABLET | Freq: Every day | ORAL | Status: DC
  Administered 2022-04-21 – 2022-04-25 (×4): 1 via ORAL

## 2022-04-18 MED ORDER — POTASSIUM CHLORIDE 10 MEQ/100ML IV SOLN
10.0000 meq | INTRAVENOUS | Status: AC
  Administered 2022-04-18 (×3): 10 meq via INTRAVENOUS
  Filled 2022-04-18 (×2): qty 100

## 2022-04-18 MED ORDER — PHENYLEPHRINE 80 MCG/ML (10ML) SYRINGE FOR IV PUSH (FOR BLOOD PRESSURE SUPPORT)
PREFILLED_SYRINGE | INTRAVENOUS | Status: AC
  Filled 2022-04-18: qty 20

## 2022-04-18 MED ORDER — BUPIVACAINE-EPINEPHRINE (PF) 0.25% -1:200000 IJ SOLN
INTRAMUSCULAR | Status: AC
  Filled 2022-04-18: qty 60

## 2022-04-18 MED ORDER — PIPERACILLIN-TAZOBACTAM 3.375 G IVPB 30 MIN
3.3750 g | Freq: Once | INTRAVENOUS | Status: AC
  Administered 2022-04-18: 3.375 g via INTRAVENOUS
  Filled 2022-04-18: qty 50

## 2022-04-18 MED ORDER — FENTANYL CITRATE (PF) 250 MCG/5ML IJ SOLN
INTRAMUSCULAR | Status: DC | PRN
  Administered 2022-04-18: 100 ug via INTRAVENOUS
  Administered 2022-04-18: 50 ug via INTRAVENOUS
  Administered 2022-04-18 (×2): 100 ug via INTRAVENOUS

## 2022-04-18 MED ORDER — METRONIDAZOLE 500 MG/100ML IV SOLN
500.0000 mg | Freq: Two times a day (BID) | INTRAVENOUS | Status: DC
  Administered 2022-04-18: 500 mg via INTRAVENOUS
  Filled 2022-04-18: qty 100

## 2022-04-18 MED ORDER — ONDANSETRON HCL 4 MG/2ML IJ SOLN
INTRAMUSCULAR | Status: DC | PRN
  Administered 2022-04-18: 4 mg via INTRAVENOUS

## 2022-04-18 MED ORDER — METHOCARBAMOL 1000 MG/10ML IJ SOLN: 500.0000 mg | Freq: Three times a day (TID) | INTRAVENOUS | Status: DC | PRN

## 2022-04-18 MED ORDER — CHLORHEXIDINE GLUCONATE 0.12 % MT SOLN
15.0000 mL | Freq: Once | OROMUCOSAL | Status: AC
  Administered 2022-04-18: 15 mL via OROMUCOSAL

## 2022-04-18 MED ORDER — DEXAMETHASONE SODIUM PHOSPHATE 10 MG/ML IJ SOLN
INTRAMUSCULAR | Status: AC
  Filled 2022-04-18: qty 1

## 2022-04-18 MED ORDER — PIPERACILLIN-TAZOBACTAM 3.375 G IVPB
3.3750 g | Freq: Three times a day (TID) | INTRAVENOUS | Status: DC
  Administered 2022-04-18 – 2022-04-25 (×20): 3.375 g via INTRAVENOUS
  Filled 2022-04-18 (×20): qty 50

## 2022-04-18 MED ORDER — DEXAMETHASONE SODIUM PHOSPHATE 10 MG/ML IJ SOLN
INTRAMUSCULAR | Status: DC | PRN
  Administered 2022-04-18: 5 mg via INTRAVENOUS

## 2022-04-18 MED ORDER — ROCURONIUM BROMIDE 10 MG/ML (PF) SYRINGE
PREFILLED_SYRINGE | INTRAVENOUS | Status: DC | PRN
  Administered 2022-04-18: 50 mg via INTRAVENOUS
  Administered 2022-04-18: 20 mg via INTRAVENOUS

## 2022-04-18 MED ORDER — PROCHLORPERAZINE MALEATE 10 MG PO TABS: 10.0000 mg | ORAL_TABLET | Freq: Four times a day (QID) | ORAL | Status: DC | PRN

## 2022-04-18 MED ORDER — KETOROLAC TROMETHAMINE 30 MG/ML IJ SOLN
30.0000 mg | Freq: Three times a day (TID) | INTRAMUSCULAR | Status: DC | PRN
  Administered 2022-04-18: 30 mg via INTRAVENOUS
  Filled 2022-04-18: qty 1

## 2022-04-18 MED ORDER — POLYETHYLENE GLYCOL 3350 17 G PO PACK: 17.0000 g | PACK | Freq: Every day | ORAL | Status: DC | PRN

## 2022-04-18 MED ORDER — ACETAMINOPHEN 500 MG PO TABS
1000.0000 mg | ORAL_TABLET | Freq: Four times a day (QID) | ORAL | Status: DC
  Administered 2022-04-18 – 2022-04-24 (×13): 1000 mg via ORAL
  Filled 2022-04-18 (×21): qty 2

## 2022-04-18 MED ORDER — SODIUM CHLORIDE 0.9 % IV SOLN
1000.0000 mL | INTRAVENOUS | Status: DC
  Administered 2022-04-18 (×2): 1000 mL via INTRAVENOUS

## 2022-04-18 MED ORDER — OXYCODONE HCL 5 MG PO TABS: 5.0000 mg | ORAL_TABLET | ORAL | Status: DC | PRN

## 2022-04-18 MED ORDER — SODIUM CHLORIDE 0.9 % IV SOLN: INTRAVENOUS | Status: DC

## 2022-04-18 SURGICAL SUPPLY — 43 items
ADH SKN CLS APL DERMABOND .7 (GAUZE/BANDAGES/DRESSINGS) ×1
APL PRP STRL LF DISP 70% ISPRP (MISCELLANEOUS) ×1
APPLIER CLIP ROT 10 11.4 M/L (STAPLE)
APR CLP MED LRG 11.4X10 (STAPLE)
BAG COUNTER SPONGE SURGICOUNT (BAG) IMPLANT
BAG SPNG CNTER NS LX DISP (BAG)
CABLE HIGH FREQUENCY MONO STRZ (ELECTRODE) ×1 IMPLANT
CHLORAPREP W/TINT 26 (MISCELLANEOUS) ×1 IMPLANT
CLIP APPLIE ROT 10 11.4 M/L (STAPLE) IMPLANT
CUTTER FLEX LINEAR 45M (STAPLE) IMPLANT
DERMABOND ADVANCED .7 DNX12 (GAUZE/BANDAGES/DRESSINGS) ×1 IMPLANT
DRAIN CHANNEL 19F RND (DRAIN) IMPLANT
ELECT REM PT RETURN 15FT ADLT (MISCELLANEOUS) ×1 IMPLANT
ENDOLOOP SUT PDS II  0 18 (SUTURE)
ENDOLOOP SUT PDS II 0 18 (SUTURE) IMPLANT
EVACUATOR SILICONE 100CC (DRAIN) IMPLANT
GLOVE BIO SURGEON STRL SZ7.5 (GLOVE) ×1 IMPLANT
GOWN STRL REUS W/ TWL LRG LVL3 (GOWN DISPOSABLE) IMPLANT
GOWN STRL REUS W/TWL LRG LVL3 (GOWN DISPOSABLE)
IRRIG SUCT STRYKERFLOW 2 WTIP (MISCELLANEOUS) ×1
IRRIGATION SUCT STRKRFLW 2 WTP (MISCELLANEOUS) ×1 IMPLANT
KIT BASIN OR (CUSTOM PROCEDURE TRAY) ×1 IMPLANT
KIT TURNOVER KIT A (KITS) IMPLANT
PENCIL SMOKE EVACUATOR (MISCELLANEOUS) IMPLANT
RELOAD 45 THICK GREEN (ENDOMECHANICALS) ×1 IMPLANT
RELOAD 45 VASCULAR/THIN (ENDOMECHANICALS) IMPLANT
RELOAD STAPLE 45 2.5 WHT GRN (ENDOMECHANICALS) IMPLANT
RELOAD STAPLE 45 3.5 BLU ETS (ENDOMECHANICALS) IMPLANT
RELOAD STAPLE 45 GRN THCK ETS (ENDOMECHANICALS) IMPLANT
RELOAD STAPLE TA45 3.5 REG BLU (ENDOMECHANICALS) IMPLANT
SCISSORS LAP 5X35 DISP (ENDOMECHANICALS) ×1 IMPLANT
SET TUBE SMOKE EVAC HIGH FLOW (TUBING) ×1 IMPLANT
SHEARS HARMONIC ACE PLUS 36CM (ENDOMECHANICALS) ×1 IMPLANT
SPIKE FLUID TRANSFER (MISCELLANEOUS) ×1 IMPLANT
SUT ETHILON 3 0 PS 1 (SUTURE) IMPLANT
SUT MNCRL AB 4-0 PS2 18 (SUTURE) ×1 IMPLANT
SYS BAG RETRIEVAL 10MM (BASKET) ×1
SYSTEM BAG RETRIEVAL 10MM (BASKET) ×1 IMPLANT
TOWEL OR 17X26 10 PK STRL BLUE (TOWEL DISPOSABLE) ×1 IMPLANT
TRAY FOLEY MTR SLVR 16FR STAT (SET/KITS/TRAYS/PACK) IMPLANT
TRAY LAPAROSCOPIC (CUSTOM PROCEDURE TRAY) ×1 IMPLANT
TROCAR BALLN 12MMX100 BLUNT (TROCAR) ×1 IMPLANT
TROCAR Z-THREAD OPTICAL 5X100M (TROCAR) ×1 IMPLANT

## 2022-04-18 NOTE — Interval H&P Note (Signed)
History and Physical Interval Note:  04/18/2022 10:57 AM  Victoria Mcintyre  has presented today for surgery, with the diagnosis of Acute Appendicitis.  The various methods of treatment have been discussed with the patient and family. After consideration of risks, benefits and other options for treatment, the patient has consented to  Procedure(s): APPENDECTOMY LAPAROSCOPIC (N/A) as a surgical intervention.  The patient's history has been reviewed, patient examined, no change in status, stable for surgery.  I have reviewed the patient's chart and labs.  Questions were answered to the patient's satisfaction.     [name]

## 2022-04-18 NOTE — ED Notes (Signed)
Pt called by CT. No response. Not in seat or anywhere in lobby.

## 2022-04-18 NOTE — TOC Progression Note (Signed)
Transition of Care [facility]) - Progression Note    Patient Details  Name: Victoria Mcintyre MRN: [id number] Date of Birth: Jul 15, 1975  [facility] CM/SW Contact  Purcell Mouton, RN Phone Number: 04/18/2022, 8:27 AM  Clinical Narrative:      Transition of Care (TOC) Screening Note   Patient Details  Name: Victoria Mcintyre Date of Birth: 12/03/1975   Transition of Care [facility]) CM/SW Contact:    Purcell Mouton, RN Phone Number: 04/18/2022, 8:27 AM    Transition of Care Department (TOC) has reviewed patient and no TOC needs have been identified at this time. We will continue to monitor patient advancement through interdisciplinary progression rounds. If new patient transition needs arise, please place a TOC consult.         Expected Discharge Plan and Services                                                 Social Determinants of Health (SDOH) Interventions    Readmission Risk Interventions     No data to display

## 2022-04-18 NOTE — Transfer of Care (Signed)
Immediate Anesthesia Transfer of Care Note  Patient: Ave Scharnhorst  Procedure(s) Performed: APPENDECTOMY LAPAROSCOPIC (Abdomen)  Patient Location: PACU  Anesthesia Type:General  Level of Consciousness: awake  Airway & Oxygen Therapy: Patient Spontanous Breathing and Patient connected to face mask oxygen  Post-op Assessment: Report given to RN and Post -op Vital signs reviewed and stable  Post vital signs: Reviewed and stable  Last Vitals:  Vitals Value Taken Time  BP 104/60 04/18/22 1555  Temp    Pulse 98 04/18/22 1558  Resp 13 04/18/22 1558  SpO2 100 % 04/18/22 1558  Vitals shown include unvalidated device data.  Last Pain:  Vitals:   04/18/22 1317  TempSrc:   PainSc: 4       Patients Stated Pain Goal: 0 (17/79/39 0300)  Complications: No notable events documented.

## 2022-04-18 NOTE — ED Provider Notes (Signed)
[facility] DEPT Provider Note  CSN: [id number] Arrival date & time: 04/18/22 3893  Chief Complaint(s) Abdominal Pain  HPI Victoria Mcintyre is a 46 y.o. female    The history is provided by the patient.  Abdominal Pain Pain location:  Generalized (started in lower abdomen) Pain quality: cramping   Pain severity:  Moderate Onset quality:  Gradual Duration:  3 days Timing:  Constant Progression:  Waxing and waning Chronicity:  New Context: suspicious food intake   Relieved by:  Nothing Worsened by:  Vomiting and eating Associated symptoms: nausea and vomiting   Associated symptoms: no cough, no diarrhea, no dysuria, no fever and no flatus    Patient was previously in [facility], but wait time was too long so she came here. She had labs drawn. Will note below.  Past Medical History Past Medical History:  Diagnosis Date   Anxiety    Depression    Lichen sclerosus et atrophicus of the vulva 12/2007   Patient Active Problem List   Diagnosis Date Noted   Acute appendicitis 04/18/2022   Anxiety 07/24/2019   Grief at loss of child 73/42/8768   Lichen sclerosus et atrophicus of the vulva 12/29/2013   Home Medication(s) Prior to Admission medications   Medication Sig Start Date End Date Taking? Authorizing Provider  clobetasol ointment (TEMOVATE) 0.05 % Use small amount 2 times day for flare.  Do not use for more than 5 days each month. 04/27/21  Yes Megan Salon, MD  levonorgestrel-ethinyl estradiol (ALESSE) 0.1-20 MG-MCG tablet Take 1 tablet by mouth daily. 04/27/21  Yes Megan Salon, MD  ondansetron (ZOFRAN) 8 MG tablet Take 8 mg by mouth 3 (three) times daily. 04/17/22  Yes [provider]  sertraline (ZOLOFT) 25 MG tablet Take 25 mg by mouth daily.   Yes [provider]                                                                                                                                    Allergies Patient has no known  allergies.  Review of Systems Review of Systems  Constitutional:  Negative for fever.  Respiratory:  Negative for cough.   Gastrointestinal:  Positive for abdominal pain, nausea and vomiting. Negative for diarrhea and flatus.  Genitourinary:  Negative for dysuria.   As noted in HPI  Physical Exam Vital Signs  I have reviewed the triage vital signs BP (!) 98/50   Pulse 92   Temp 99 F (37.2 C) (Oral)   Resp 16   LMP 04/08/2022 Comment: negative beta HCG 04/17/22  SpO2 98%   Physical Exam Vitals reviewed.  Constitutional:      General: She is not in acute distress.    Appearance: She is well-developed. She is not diaphoretic.  HENT:     Head: Normocephalic and atraumatic.     Right Ear: External ear normal.     Left  Ear: External ear normal.     Nose: Nose normal.  Eyes:     General: No scleral icterus.    Conjunctiva/sclera: Conjunctivae normal.  Neck:     Trachea: Phonation normal.  Cardiovascular:     Rate and Rhythm: Normal rate and regular rhythm.  Pulmonary:     Effort: Pulmonary effort is normal. No respiratory distress.     Breath sounds: No stridor.  Abdominal:     General: There is no distension.     Tenderness: There is abdominal tenderness in the right lower quadrant, suprapubic area and left lower quadrant. There is no guarding.  Musculoskeletal:        General: Normal range of motion.     Cervical back: Normal range of motion.  Neurological:     Mental Status: She is alert and oriented to person, place, and time.  Psychiatric:        Behavior: Behavior normal.     ED Results and Treatments Labs (all labs ordered are listed, but only abnormal results are displayed) Labs Reviewed  URINALYSIS, ROUTINE W REFLEX MICROSCOPIC                                                                                                                         EKG  EKG Interpretation  Date/Time:    Ventricular Rate:    PR Interval:    QRS Duration:   QT  Interval:    QTC Calculation:   R Axis:     Text Interpretation:         Radiology CT ABDOMEN PELVIS W CONTRAST  Result Date: 04/18/2022 CLINICAL DATA:  Abdominal pain with nausea and vomiting EXAM: CT ABDOMEN AND PELVIS WITH CONTRAST TECHNIQUE: Multidetector CT imaging of the abdomen and pelvis was performed using the standard protocol following bolus administration of intravenous contrast. RADIATION DOSE REDUCTION: This exam was performed according to the departmental dose-optimization program which includes automated exposure control, adjustment of the mA and/or kV according to patient size and/or use of iterative reconstruction technique. CONTRAST:  OMNIPAQUE IOHEXOL 300 MG/ML  SOLN COMPARISON:  None Available. FINDINGS: Lower Chest: Normal. Hepatobiliary: Normal hepatic contours. No intra- or extrahepatic biliary dilatation. The gallbladder is normal. Pancreas: Normal pancreas. No ductal dilatation or peripancreatic fluid collection. Spleen: Normal. Adrenals/Urinary Tract: The adrenal glands are normal. No hydronephrosis, nephroureterolithiasis or solid renal mass. The urinary bladder is normal for degree of distention Stomach/Bowel: There is no hiatal hernia. Normal duodenal course and caliber. No small bowel dilatation or inflammation. No focal colonic abnormality. Appendix location: Retrocecal (11 o'clock) Diameter: 11 mm Appendicolith: None Mucosal hyper-enhancement: Yes Extraluminal gas: Small amount anterior to the distal appendix (series 2, image 63). Periappendiceal collection: No discrete collection. Large amount of right lower quadrant inflammatory stranding. Vascular/Lymphatic: Normal course and caliber of the major abdominal vessels. No abdominal or pelvic lymphadenopathy. Reproductive: Normal uterus. No adnexal mass. Other: Small volume free fluid in the anterior pelvis. Musculoskeletal: No bony spinal canal  stenosis or focal osseous abnormality. IMPRESSION: 1. Acute appendicitis  with small amount of extraluminal gas anterior to the distal appendix, consistent with small perforation. No discrete collection Electronically Signed   By: Deatra Robinson M.D.   On: 04/18/2022 02:44    Medications Ordered in ED Medications  morphine (PF) 4 MG/ML injection 4 mg (4 mg Intravenous Given 04/18/22 0345)  sodium chloride 0.9 % bolus 1,000 mL (1,000 mLs Intravenous New Bag/Given 04/18/22 0437)    Followed by  0.9 %  sodium chloride infusion (1,000 mLs Intravenous New Bag/Given 04/18/22 0439)  sodium chloride 0.9 % bolus 1,000 mL (1,000 mLs Intravenous New Bag/Given 04/18/22 0210)  prochlorperazine (COMPAZINE) injection 10 mg (10 mg Intravenous Given 04/18/22 0210)  morphine (PF) 4 MG/ML injection 4 mg (4 mg Intravenous Given 04/18/22 0209)  iohexol (OMNIPAQUE) 300 MG/ML solution 100 mL (100 mLs Intravenous Contrast Given 04/18/22 0225)  sodium chloride (PF) 0.9 % injection (  Given 04/18/22 0255)  piperacillin-tazobactam (ZOSYN) IVPB 3.375 g (3.375 g Intravenous New Bag/Given 04/18/22 0344)                                                                                                                                     Procedures .1-3 Lead EKG Interpretation  Performed by: Nira Conn, MD Authorized by: Nira Conn, MD     Interpretation: normal     ECG rate:  98   ECG rate assessment: normal     Rhythm: sinus rhythm     Ectopy: none     Conduction: normal   .Critical Care  Performed by: Nira Conn, MD Authorized by: Nira Conn, MD   Critical care provider statement:    Critical care time (minutes):  45   Critical care time was exclusive of:  Separately billable procedures and treating other patients   Critical care was necessary to treat or prevent imminent or life-threatening deterioration of the following conditions: perforated appendicitis.   Critical care was time spent personally by me on the following activities:   Development of treatment plan with patient or surrogate, discussions with consultants, evaluation of patient's response to treatment, examination of patient, obtaining history from patient or surrogate, review of old charts, re-evaluation of patient's condition, pulse oximetry, ordering and review of radiographic studies, ordering and review of laboratory studies and ordering and performing treatments and interventions   Care discussed with: admitting provider     (including critical care time)  Medical Decision Making / ED Course   Medical Decision Making Amount and/or Complexity of Data Reviewed Labs: ordered. Decision-making details documented in ED Course. Radiology: ordered and independent interpretation performed. Decision-making details documented in ED Course.  Risk Prescription drug management. Parenteral controlled substances. Decision regarding hospitalization. Emergency major surgery.    Abd pain. Will assess for intraabdominal inflammatory/infectious process or SBO. Will also assess for UTI/pyelo. Hcg at [facility] negative ruling out pregnancy related  process.  Labs from [facility] noted for: CBC with leukocytosis.  Also noted to be hemoconcentrated. Metabolic panel with mild electrolyte derangements.  No renal sufficiency.  No evidence of bili obstruction or pancreatitis.  CT scan ordered here.  Notable for no evidence of appendicitis.  Radiology agreed and noted also evidence of perforation.  Patient was started on empiric antibiotics.  Given IV fluids, pain medicine and nausea medicine.  I spoke with Dr. Donell Beers from general surgery who admitted patient for further work-up and management.       Final Clinical Impression(s) / ED Diagnoses Final diagnoses:  Perforated appendicitis           This chart was dictated using voice recognition software.  Despite best efforts to proofread,  errors can occur which can change the documentation meaning.    Nira Conn, MD 04/18/22 [phone]

## 2022-04-18 NOTE — Op Note (Signed)
04/18/2022  4:09 PM  PATIENT:  Victoria Mcintyre  46 y.o. female  PRE-OPERATIVE DIAGNOSIS:  Acute Appendicitis  POST-OPERATIVE DIAGNOSIS:  Acute Appendicitis  PROCEDURE:  Procedure(s): APPENDECTOMY LAPAROSCOPIC (N/A)  SURGEON:  Surgeon(s) and Role:    Griselda Miner, MD - Primary  PHYSICIAN ASSISTANT:   ASSISTANTS: none   ANESTHESIA:   local and general  EBL:  50 mL   BLOOD ADMINISTERED:none  DRAINS: (1) Blake drain(s) in the pelvis    LOCAL MEDICATIONS USED:  MARCAINE     SPECIMEN:  Source of Specimen:  appendix  DISPOSITION OF SPECIMEN:  PATHOLOGY  COUNTS:  YES  TOURNIQUET:  * No tourniquets in log *  DICTATION: .Dragon Dictation  After informed consent was obtained patient was brought to the operating room placed in the supine position on the operating room table. After adequate induction of general anesthesia the patient's abdomen was prepped with ChloraPrep, allowed to dry, and draped in usual sterile manner. The area below the umbilicus was infiltrated with quarter percent Marcaine. A small incision was made with a 15 blade knife. This incision was carried down through the subcutaneous tissue bluntly with a hemostat and [employer] retractors until the linea alba was identified. The linea alba was incised with a 15 blade knife. Each side was grasped Coker clamps and elevated anteriorly. The preperitoneal space was probed bluntly with a hemostat until the peritoneum was opened and access was gained to the abdominal cavity. A 0 Vicryl purse string stitch was placed in the fascia surrounding the opening. A Hassan cannula was placed through the opening and anchored in place with the previously placed Vicryl purse string stitch. The laparoscope was placed through the [facility] cannula. The abdomen was insufflated with carbon dioxide without difficulty. Next the suprapubic area was infiltrated with quarter percent Marcaine. A small incision was made with a 15 blade knife. A 5 mm  port was placed bluntly through this incision into the abdominal cavity. A site was then chosen between the 2 port for placement of a 5 mm port. The area was infiltrated with quarter percent Marcaine. A small stab incision was made with a 15 blade knife. A 5 mm port was placed bluntly through this incision and the abdominal cavity under direct vision. The laparoscope was then moved to the suprapubic port. Using a Glassman grasper and harmonic scalpel the right lower quadrant was inspected.  A large amount of pus was identified.  There were many interloop loculations that were broken up and drained.  The appendix was  identified. The appendix was elevated anteriorly and the mesoappendix was taken down sharply with the harmonic scalpel. Once the base of the appendix where it joined the cecum was identified and cleared of any tissue then a laparoscopic GIA green load stapler was placed through the [facility] cannula. The stapler was placed across the base of the appendix clamped and fired thereby dividing the base of the appendix between staple lines. A laparoscopic bag was then inserted through the [facility] cannula. The appendix was placed within the bag and the bag was sealed. The abdomen was then irrigated with copious amounts of saline until the effluent was clear. No other abnormalities were noted. The appendix and bag were removed with the [facility] cannula through the infraumbilical port without difficulty. The fascial defect was closed with the previously placed Vicryl pursestring stitch as well as with another interrupted 0 Vicryl figure-of-eight stitch. The rest of the ports were removed under direct vision and were found  to be hemostatic. The gas was allowed to escape. The skin incisions were closed with interrupted 4-0 Monocryl subcuticular stitches.  2 x 2 dressings and Tegaderm were applied.  A drain was placed through the abdominal wall at the lower port site location and into the pelvis.  The drain was anchored  to the skin with a 3-0 nylon stitch. The patient tolerated the procedure well. At the end of the case all needle sponge and instrument counts were correct. The patient was then awakened and taken to recovery in stable condition.   PLAN OF CARE: Admit to inpatient   PATIENT DISPOSITION:  PACU - hemodynamically stable.   Delay start of Pharmacological VTE agent (>24hrs) due to surgical blood loss or risk of bleeding: no

## 2022-04-18 NOTE — ED Triage Notes (Signed)
Pt reporting cramping and vomiting on Friday night, improved yesterday, onset on severe abdominal cramping today.

## 2022-04-18 NOTE — Anesthesia Postprocedure Evaluation (Signed)
Anesthesia Post Note  Patient: Victoria Mcintyre  Procedure(s) Performed: APPENDECTOMY LAPAROSCOPIC (Abdomen)     Patient location during evaluation: PACU Anesthesia Type: General Level of consciousness: awake and alert Pain management: pain level controlled Vital Signs Assessment: post-procedure vital signs reviewed and stable Respiratory status: spontaneous breathing, nonlabored ventilation and respiratory function stable Cardiovascular status: blood pressure returned to baseline Postop Assessment: no apparent nausea or vomiting Anesthetic complications: no   No notable events documented.  Last Vitals:  Vitals:   04/18/22 1630 04/18/22 1645  BP: 114/67 114/70  Pulse: 88 89  Resp: (!) 9 11  Temp:    SpO2: 98% 99%    Last Pain:  Vitals:   04/18/22 1645  TempSrc:   PainSc: Asleep                 Marthenia Rolling

## 2022-04-18 NOTE — ED Notes (Signed)
Pt reporting Nausea and vomiting starting Friday she stated that over night she was vomiting violently over night on Friday and had three small bowel movements. Pt denies diarrhea. Pt states that she has not had a BM  sense  Friday but has poor po intake. Pt reports that she remembers passing gas yesterday but does not think that she has today. Pt reports that she has "very dark" urine. Pt complaining of urinary frequency. Pt reports last menstrual period starting 9/30.  Pt currently reporting 10/10 abdominal pain. Pt given heat packs and warm blanket for comfort. Pt denies fever but states that she has had chills.

## 2022-04-18 NOTE — Anesthesia Preprocedure Evaluation (Addendum)
Anesthesia Evaluation  Patient identified by MRN, date of birth, ID band Patient awake    Reviewed: Allergy & Precautions, NPO status , Patient's Chart, lab work & pertinent test results  Airway Mallampati: II  TM Distance: >3 FB Neck ROM: Full    Dental no notable dental hx.    Pulmonary neg pulmonary ROS,    Pulmonary exam normal        Cardiovascular negative cardio ROS   Rhythm:Regular Rate:Normal     Neuro/Psych Anxiety Depression negative neurological ROS     GI/Hepatic Neg liver ROS, Acute appendicitis    Endo/Other  negative endocrine ROS  Renal/GU negative Renal ROS  negative genitourinary   Musculoskeletal negative musculoskeletal ROS (+)   Abdominal Normal abdominal exam  (+)   Peds  Hematology negative hematology ROS (+)   Anesthesia Other Findings   Reproductive/Obstetrics                            Anesthesia Physical Anesthesia Plan  ASA: 2  Anesthesia Plan: General   Post-op Pain Management: Tylenol PO (pre-op)*   Induction: Intravenous  PONV Risk Score and Plan: 3 and Ondansetron, Dexamethasone, Midazolam, Treatment may vary due to age or medical condition, Scopolamine patch - Pre-op and Amisulpride  Airway Management Planned: Mask and Oral ETT  Additional Equipment: None  Intra-op Plan:   Post-operative Plan: Extubation in OR  Informed Consent: I have reviewed the patients History and Physical, chart, labs and discussed the procedure including the risks, benefits and alternatives for the proposed anesthesia with the patient or authorized representative who has indicated his/her understanding and acceptance.     Dental advisory given  Plan Discussed with: CRNA  Anesthesia Plan Comments: (Lab Results      Component                Value               Date                      WBC                      9.1                 04/18/2022                HGB                       12.3                04/18/2022                HCT                      37.7                04/18/2022                MCV                      91.1                04/18/2022                PLT  180                 04/18/2022           Lab Results      Component                Value               Date                      NA                       134 (L)             04/18/2022                K                        3.6                 04/18/2022                CO2                      21 (L)              04/18/2022                GLUCOSE                  102 (H)             04/18/2022                BUN                      7                   04/18/2022                CREATININE               0.75                04/18/2022                CALCIUM                  7.9 (L)             04/18/2022                GFRNONAA                 >60                 04/18/2022          )       Anesthesia Quick Evaluation

## 2022-04-18 NOTE — H&P (Signed)
Victoria Mcintyre is an 46 y.o. female.   Chief Complaint: abdominal pain  HPI:  Victoria Mcintyre is a 46yo female PMH anxiety/depression who presented to [facility] last night complaining of worsening abdominal pain. States that the pain started 4 days ago on Friday and was initially generalized. Associated with nausea and vomiting. Pain somewhat improved for a couple days, then became worse again today. Pain is now mostly in the lower quadrants. She has had low grade temps. In the ED patient was found to be tachycardic with TMAX 100.6, BP ok. WBC 12. CT scan abdomen/ pelvis shows acute appendicitis with small amount of extraluminal gas anterior to the distal appendix, consistent with small perforation, no discrete collection. General surgery asked to see.  No prior h/o abdominal surgery Nonsmoker Drinks alcohol socially on the weekends Denies illicit drug use Not currently working [employer]  Past Medical History:  Diagnosis Date   Anxiety    Depression    Lichen sclerosus et atrophicus of the vulva 12/2007    Past Surgical History:  Procedure Laterality Date   WISDOM TOOTH EXTRACTION Bilateral 2002    Family History  Problem Relation Age of Onset   Osteoarthritis Father    Heart disease Father    Seizures Father    Autoimmune disease Father    Asthma Brother    Heart failure Paternal Grandfather    Breast cancer Paternal Grandmother    Heart failure Paternal Grandmother    Cancer Paternal Grandmother        pancreatic   Diabetes Paternal Grandmother    Cancer Paternal Aunt        melanoma   Social History:  reports that she has never smoked. She has never used smokeless tobacco. She reports current alcohol use of about 4.0 standard drinks of alcohol per week. She reports that she does not use drugs.  Allergies: No Known Allergies  Meds: clobetasol ointment (TEMOVATE) 0.05 % levonorgestrel-ethinyl estradiol (ALESSE) 0.1-20 MG-MCG tablet ondansetron (ZOFRAN) 8 MG tablet sertraline  (ZOLOFT) 25 MG tablet  Results for orders placed or performed during the hospital encounter of 04/17/22 (from the past 48 hour(s))  Lipase, blood     Status: None   Collection Time: 04/17/22  8:25 PM  Result Value Ref Range   Lipase 24 11 - 51 U/L    Comment: Performed at [facility], [address], [place], [place] [address]  Comprehensive metabolic panel     Status: Abnormal   Collection Time: 04/17/22  8:25 PM  Result Value Ref Range   Sodium 132 (L) 135 - 145 mmol/L   Potassium 3.4 (L) 3.5 - 5.1 mmol/L   Chloride 97 (L) 98 - 111 mmol/L   CO2 22 22 - 32 mmol/L   Glucose, Bld 141 (H) 70 - 99 mg/dL    Comment: Glucose reference range applies only to samples taken after fasting for at least 8 hours.   BUN 6 6 - 20 mg/dL   Creatinine, Ser 9.73 0.44 - 1.00 mg/dL   Calcium 8.8 (L) 8.9 - 10.3 mg/dL   Total Protein 6.7 6.5 - 8.1 g/dL   Albumin 3.4 (L) 3.5 - 5.0 g/dL   AST 25 15 - 41 U/L   ALT 22 0 - 44 U/L   Alkaline Phosphatase 68 38 - 126 U/L   Total Bilirubin 0.9 0.3 - 1.2 mg/dL   GFR, Estimated >53 >29 mL/min    Comment: (NOTE) Calculated using the CKD-EPI Creatinine Equation (2021)  Anion gap 13 5 - 15    Comment: Performed at [facility] [address] [place], [place] [address]  CBC     Status: Abnormal   Collection Time: 04/17/22  8:25 PM  Result Value Ref Range   WBC 12.0 (H) 4.0 - 10.5 K/uL   RBC 4.99 3.87 - 5.11 MIL/uL   Hemoglobin 15.2 (H) 12.0 - 15.0 g/dL   HCT 45.0 36.0 - 46.0 %   MCV 90.2 80.0 - 100.0 fL   MCH 30.5 26.0 - 34.0 pg   MCHC 33.8 30.0 - 36.0 g/dL   RDW 12.2 11.5 - 15.5 %   Platelets 211 150 - 400 K/uL   nRBC 0.0 0.0 - 0.2 %    Comment: Performed at [facility], [place] [address] [place], [place] [address]  I-Stat beta hCG blood, ED     Status: None   Collection Time: 04/17/22  9:25 PM  Result Value Ref Range   I-stat hCG, quantitative <5.0 <5 mIU/mL   Comment 3            Comment:   GEST. AGE      CONC.  (mIU/mL)   <=1  WEEK        5 - 50     2 WEEKS       50 - 500     3 WEEKS       100 - 10,000     4 WEEKS     1,000 - 30,000        FEMALE AND NON-PREGNANT FEMALE:     LESS THAN 5 mIU/mL    CT ABDOMEN PELVIS W CONTRAST  Result Date: 04/18/2022 CLINICAL DATA:  Abdominal pain with nausea and vomiting EXAM: CT ABDOMEN AND PELVIS WITH CONTRAST TECHNIQUE: Multidetector CT imaging of the abdomen and pelvis was performed using the standard protocol following bolus administration of intravenous contrast. RADIATION DOSE REDUCTION: This exam was performed according to the departmental dose-optimization program which includes automated exposure control, adjustment of the mA and/or kV according to patient size and/or use of iterative reconstruction technique. CONTRAST:  125mL OMNIPAQUE IOHEXOL 300 MG/ML  SOLN COMPARISON:  None Available. FINDINGS: Lower Chest: Normal. Hepatobiliary: Normal hepatic contours. No intra- or extrahepatic biliary dilatation. The gallbladder is normal. Pancreas: Normal pancreas. No ductal dilatation or peripancreatic fluid collection. Spleen: Normal. Adrenals/Urinary Tract: The adrenal glands are normal. No hydronephrosis, nephroureterolithiasis or solid renal mass. The urinary bladder is normal for degree of distention Stomach/Bowel: There is no hiatal hernia. Normal duodenal course and caliber. No small bowel dilatation or inflammation. No focal colonic abnormality. Appendix location: Retrocecal (11 o'clock) Diameter: 11 mm Appendicolith: None Mucosal hyper-enhancement: Yes Extraluminal gas: Small amount anterior to the distal appendix (series 2, image 63). Periappendiceal collection: No discrete collection. Large amount of right lower quadrant inflammatory stranding. Vascular/Lymphatic: Normal course and caliber of the major abdominal vessels. No abdominal or pelvic lymphadenopathy. Reproductive: Normal uterus. No adnexal mass. Other: Small volume free fluid in the anterior pelvis. Musculoskeletal: No  bony spinal canal stenosis or focal osseous abnormality. IMPRESSION: 1. Acute appendicitis with small amount of extraluminal gas anterior to the distal appendix, consistent with small perforation. No discrete collection Electronically Signed   By: Ulyses Jarred M.D.   On: 04/18/2022 02:44    Review of Systems  Constitutional:  Positive for fever.  Gastrointestinal:  Positive for abdominal pain, nausea and vomiting.  All other systems reviewed and are negative.   Blood  pressure 108/63, pulse (!) 104, temperature (!) 100.6 F (38.1 C), temperature source Oral, resp. rate 18, last menstrual period 04/08/2022, SpO2 95 %, unknown if currently breastfeeding. Physical Exam Vitals reviewed.  Constitutional:      General: She is not in acute distress (looks uncomfortable).    Appearance: She is well-developed.  HENT:     Head: Normocephalic and atraumatic.  Eyes:     General: No scleral icterus.    Extraocular Movements: Extraocular movements intact.     Pupils: Pupils are equal, round, and reactive to light.  Cardiovascular:     Rate and Rhythm: Regular rhythm. Tachycardia present.     Heart sounds: Normal heart sounds.  Pulmonary:     Effort: Pulmonary effort is normal.     Breath sounds: Normal breath sounds.  Abdominal:     General: Abdomen is flat. Bowel sounds are decreased. There is no distension.     Palpations: Abdomen is soft. There is no splenomegaly or mass.     Tenderness: There is abdominal tenderness in the right lower quadrant and suprapubic area. There is no guarding or rebound. Positive signs include McBurney's sign.     Hernia: No hernia is present.  Skin:    General: Skin is warm and dry.     Capillary Refill: Capillary refill takes 2 to 3 seconds.  Neurological:     General: No focal deficit present.     Mental Status: She is alert and oriented to person, place, and time.     Cranial Nerves: No cranial nerve deficit.     Motor: No weakness.  Psychiatric:         Mood and Affect: Mood normal. Mood is not anxious or depressed.        Behavior: Behavior normal.      Assessment/Plan Acute appendicitis, possibly perforated Hyponatremia Hypokalemia Hypochloremia hyperglycemia  ID - zosyn FEN - IVF, NPO. Replete K VTE - will start lovenox postop Foley - none  Dispo - Patient with acute appendicitis, concern for perforation. Will discuss with MD but likely plan for surgery today for laparoscopic appendectomy. I have discussed the procedure and risks of appendectomy. The risks include but are not limited to bleeding, infection, anesthesia, injury to intra-abdominal organs, possibility of postoperative ileus, possibility of open procedure or ileocecectomy. She seems to understand and agrees with the plan. Keep NPO. Zosyn reordered.    [facility] A Starleen Trussell, PA-C 04/18/2022, 4:06 AM

## 2022-04-18 NOTE — Anesthesia Procedure Notes (Signed)
Procedure Name: Intubation Date/Time: 04/18/2022 2:09 PM  Performed by: Lollie Sails, CRNAPre-anesthesia Checklist: Patient identified, Emergency Drugs available, Suction available, Patient being monitored and Timeout performed Patient Re-evaluated:Patient Re-evaluated prior to induction Oxygen Delivery Method: Circle system utilized Preoxygenation: Pre-oxygenation with 100% oxygen Induction Type: IV induction Ventilation: Mask ventilation without difficulty Laryngoscope Size: Miller and 3 Grade View: Grade III Tube type: Oral Tube size: 7.0 mm Number of attempts: 1 Airway Equipment and Method: Stylet Placement Confirmation: ETT inserted through vocal cords under direct vision, positive ETCO2 and breath sounds checked- equal and bilateral Secured at: 22 cm Tube secured with: Tape Dental Injury: Teeth and Oropharynx as per pre-operative assessment

## 2022-04-19 ENCOUNTER — Encounter (HOSPITAL_COMMUNITY): Payer: Self-pay | Admitting: General Surgery

## 2022-04-19 DIAGNOSIS — Y733 Surgical instruments, materials and gastroenterology and urology devices (including sutures) associated with adverse incidents: Secondary | ICD-10-CM | POA: Diagnosis not present

## 2022-04-19 DIAGNOSIS — Z808 Family history of malignant neoplasm of other organs or systems: Secondary | ICD-10-CM | POA: Diagnosis not present

## 2022-04-19 DIAGNOSIS — F419 Anxiety disorder, unspecified: Secondary | ICD-10-CM | POA: Diagnosis present

## 2022-04-19 DIAGNOSIS — R739 Hyperglycemia, unspecified: Secondary | ICD-10-CM | POA: Diagnosis present

## 2022-04-19 DIAGNOSIS — Z833 Family history of diabetes mellitus: Secondary | ICD-10-CM | POA: Diagnosis not present

## 2022-04-19 DIAGNOSIS — Z8249 Family history of ischemic heart disease and other diseases of the circulatory system: Secondary | ICD-10-CM | POA: Diagnosis not present

## 2022-04-19 DIAGNOSIS — Y92239 Unspecified place in hospital as the place of occurrence of the external cause: Secondary | ICD-10-CM | POA: Diagnosis not present

## 2022-04-19 DIAGNOSIS — E871 Hypo-osmolality and hyponatremia: Secondary | ICD-10-CM | POA: Diagnosis present

## 2022-04-19 DIAGNOSIS — F32A Depression, unspecified: Secondary | ICD-10-CM | POA: Diagnosis present

## 2022-04-19 DIAGNOSIS — Y838 Other surgical procedures as the cause of abnormal reaction of the patient, or of later complication, without mention of misadventure at the time of the procedure: Secondary | ICD-10-CM | POA: Diagnosis not present

## 2022-04-19 DIAGNOSIS — K567 Ileus, unspecified: Secondary | ICD-10-CM | POA: Diagnosis not present

## 2022-04-19 DIAGNOSIS — Z825 Family history of asthma and other chronic lower respiratory diseases: Secondary | ICD-10-CM | POA: Diagnosis not present

## 2022-04-19 DIAGNOSIS — E876 Hypokalemia: Secondary | ICD-10-CM | POA: Diagnosis present

## 2022-04-19 DIAGNOSIS — K9189 Other postprocedural complications and disorders of digestive system: Secondary | ICD-10-CM | POA: Diagnosis not present

## 2022-04-19 DIAGNOSIS — K3532 Acute appendicitis with perforation and localized peritonitis, without abscess: Secondary | ICD-10-CM | POA: Diagnosis present

## 2022-04-19 DIAGNOSIS — K3533 Acute appendicitis with perforation and localized peritonitis, with abscess: Secondary | ICD-10-CM | POA: Diagnosis present

## 2022-04-19 DIAGNOSIS — E878 Other disorders of electrolyte and fluid balance, not elsewhere classified: Secondary | ICD-10-CM | POA: Diagnosis present

## 2022-04-19 DIAGNOSIS — Z79899 Other long term (current) drug therapy: Secondary | ICD-10-CM | POA: Diagnosis not present

## 2022-04-19 LAB — BASIC METABOLIC PANEL
Anion gap: 7 (ref 5–15)
BUN: 18 mg/dL (ref 6–20)
CO2: 22 mmol/L (ref 22–32)
Calcium: 8.2 mg/dL — ABNORMAL LOW (ref 8.9–10.3)
Chloride: 102 mmol/L (ref 98–111)
Creatinine, Ser: 0.72 mg/dL (ref 0.44–1.00)
GFR, Estimated: >60 mL/min (ref >60)
Glucose, Bld: 122 mg/dL — ABNORMAL HIGH (ref 70–99)
Potassium: 4.1 mmol/L (ref 3.5–5.1)
Sodium: 131 mmol/L — ABNORMAL LOW (ref 135–145)

## 2022-04-19 LAB — CBC
HCT: 34.5 % — ABNORMAL LOW (ref 36.0–46.0)
Hemoglobin: 11 g/dL — ABNORMAL LOW (ref 12.0–15.0)
MCH: 29.8 pg (ref 26.0–34.0)
MCHC: 31.9 g/dL (ref 30.0–36.0)
MCV: 93.5 fL (ref 80.0–100.0)
Platelets: 178 10*3/uL (ref 150–400)
RBC: 3.69 MIL/uL — ABNORMAL LOW (ref 3.87–5.11)
RDW: 12.6 % (ref 11.5–15.5)
WBC: 9.8 10*3/uL (ref 4.0–10.5)
nRBC: 0 % (ref 0.0–0.2)

## 2022-04-19 MED ORDER — KETOROLAC TROMETHAMINE 15 MG/ML IJ SOLN
15.0000 mg | Freq: Four times a day (QID) | INTRAMUSCULAR | Status: AC
  Administered 2022-04-19 – 2022-04-24 (×17): 15 mg via INTRAVENOUS
  Filled 2022-04-19 (×19): qty 1

## 2022-04-19 MED ORDER — DOCUSATE SODIUM 100 MG PO CAPS
100.0000 mg | ORAL_CAPSULE | Freq: Two times a day (BID) | ORAL | Status: DC
  Administered 2022-04-19 – 2022-04-25 (×10): 100 mg via ORAL
  Filled 2022-04-19 (×11): qty 1

## 2022-04-19 MED ORDER — ALUM & MAG HYDROXIDE-SIMETH 200-200-20 MG/5ML PO SUSP
30.0000 mL | Freq: Four times a day (QID) | ORAL | Status: DC | PRN
  Administered 2022-04-19: 30 mL via ORAL
  Filled 2022-04-19: qty 30

## 2022-04-19 NOTE — Progress Notes (Signed)
[facility] Progress Note  1 Day Post-Op  Subjective: CC-  Up in chair. Abdomen sore but pain is improved from prior to surgery. Mild nausea, no emesis. Tolerating clear liquids. Passed flatus once, no BM.   Objective: Vital signs in last 24 hours: Temp:  [97.5 F (36.4 C)-99.2 F (37.3 C)] 97.5 F (36.4 C) (10/11 0553) Pulse Rate:  [57-100] 60 (10/11 0912) Resp:  [9-17] 17 (10/11 0912) BP: (91-118)/(55-73) 106/67 (10/11 0912) SpO2:  [97 %-100 %] 98 % (10/11 0912) Last BM Date : 04/14/22  Intake/Output from previous day: 10/10 0701 - 10/11 0700 In: 4305.2 [P.O.:680; I.V.:3366; IV Piggyback:259.2] Out: 1255 [Urine:675; Drains:530; Blood:50] Intake/Output this shift: No intake/output data recorded.  PE: Gen:  Alert, NAD, pleasant Abd: soft, mild distension, hypoactive bowel sounds, incisions with cdi dressings, JP cloudy/serous  Lab Results:  Recent Labs    04/18/22 0942 04/19/22 0402  WBC 9.1 9.8  HGB 12.3 11.0*  HCT 37.7 34.5*  PLT 180 178   BMET Recent Labs    04/17/22 2025 04/18/22 0942  NA 132* 134*  K 3.4* 3.6  CL 97* 104  CO2 22 21*  GLUCOSE 141* 102*  BUN 6 7  CREATININE 0.86 0.75  CALCIUM 8.8* 7.9*   PT/INR No results for input(s): "LABPROT", "INR" in the last 72 hours. CMP     Component Value Date/Time   NA 134 (L) 04/18/2022 0942   K 3.6 04/18/2022 0942   CL 104 04/18/2022 0942   CO2 21 (L) 04/18/2022 0942   GLUCOSE 102 (H) 04/18/2022 0942   BUN 7 04/18/2022 0942   CREATININE 0.75 04/18/2022 0942   CALCIUM 7.9 (L) 04/18/2022 0942   PROT 6.7 04/17/2022 2025   ALBUMIN 3.4 (L) 04/17/2022 2025   AST 25 04/17/2022 2025   ALT 22 04/17/2022 2025   ALKPHOS 68 04/17/2022 2025   BILITOT 0.9 04/17/2022 2025   GFRNONAA >60 04/18/2022 0942   Lipase     Component Value Date/Time   LIPASE 24 04/17/2022 2025       Studies/Results: CT ABDOMEN PELVIS W CONTRAST  Result Date: 04/18/2022 CLINICAL DATA:  Abdominal pain with  nausea and vomiting EXAM: CT ABDOMEN AND PELVIS WITH CONTRAST TECHNIQUE: Multidetector CT imaging of the abdomen and pelvis was performed using the standard protocol following bolus administration of intravenous contrast. RADIATION DOSE REDUCTION: This exam was performed according to the departmental dose-optimization program which includes automated exposure control, adjustment of the mA and/or kV according to patient size and/or use of iterative reconstruction technique. CONTRAST:  OMNIPAQUE IOHEXOL 300 MG/ML  SOLN COMPARISON:  None Available. FINDINGS: Lower Chest: Normal. Hepatobiliary: Normal hepatic contours. No intra- or extrahepatic biliary dilatation. The gallbladder is normal. Pancreas: Normal pancreas. No ductal dilatation or peripancreatic fluid collection. Spleen: Normal. Adrenals/Urinary Tract: The adrenal glands are normal. No hydronephrosis, nephroureterolithiasis or solid renal mass. The urinary bladder is normal for degree of distention Stomach/Bowel: There is no hiatal hernia. Normal duodenal course and caliber. No small bowel dilatation or inflammation. No focal colonic abnormality. Appendix location: Retrocecal (11 o'clock) Diameter: 11 mm Appendicolith: None Mucosal hyper-enhancement: Yes Extraluminal gas: Small amount anterior to the distal appendix (series 2, image 63). Periappendiceal collection: No discrete collection. Large amount of right lower quadrant inflammatory stranding. Vascular/Lymphatic: Normal course and caliber of the major abdominal vessels. No abdominal or pelvic lymphadenopathy. Reproductive: Normal uterus. No adnexal mass. Other: Small volume free fluid in the anterior pelvis. Musculoskeletal: No bony spinal canal stenosis or focal  osseous abnormality. IMPRESSION: 1. Acute appendicitis with small amount of extraluminal gas anterior to the distal appendix, consistent with small perforation. No discrete collection Electronically Signed   By: Ulyses Jarred M.D.   On:  04/18/2022 02:44    Anti-infectives: Anti-infectives (From admission, onward)    Start     Dose/Rate Route Frequency Ordered Stop   04/18/22 2200  piperacillin-tazobactam (ZOSYN) IVPB 3.375 g        3.375 g 12.5 mL/hr over 240 Minutes Intravenous Every 8 hours 04/18/22 1744     04/18/22 1000  piperacillin-tazobactam (ZOSYN) IVPB 3.375 g  Status:  Discontinued        3.375 g 12.5 mL/hr over 240 Minutes Intravenous Every 8 hours 04/18/22 0729 04/18/22 0851   04/18/22 0900  cefTRIAXone (ROCEPHIN) 2 g in sodium chloride 0.9 % 100 mL IVPB  Status:  Discontinued       See Hyperspace for full Linked Orders Report.   2 g 200 mL/hr over 30 Minutes Intravenous Every 24 hours 04/18/22 0839 04/18/22 1744   04/18/22 0900  metroNIDAZOLE (FLAGYL) IVPB 500 mg  Status:  Discontinued       See Hyperspace for full Linked Orders Report.   500 mg 100 mL/hr over 60 Minutes Intravenous Every 12 hours 04/18/22 0839 04/18/22 1744   04/18/22 0315  piperacillin-tazobactam (ZOSYN) IVPB 3.375 g        3.375 g 100 mL/hr over 30 Minutes Intravenous  Once 04/18/22 0313 04/18/22 2536        Assessment/Plan Appendicitis with perforation and abscess POD#1 s/p laparoscopic appendectomy 10/10 Dr. Marlou Starks - continue clear liquids and await return in bowel function - continue JP and monitor output - cloudy/serous today - continue IV antibiotics - mobilize - schedule toradol in addition to the tylenol to help minimize narcotics - BMP pending  ID - zosyn 10/10>> FEN - IVF@100cc /hr, CLD VTE - SCDs, lovenox Foley - none     LOS: 0 days    Wellington Hampshire, [facility] 04/19/2022, 9:58 AM Please see Amion for pager number during day hours 7:00am-4:30pm

## 2022-04-20 LAB — CBC
HCT: 34.9 % — ABNORMAL LOW (ref 36.0–46.0)
Hemoglobin: 11.5 g/dL — ABNORMAL LOW (ref 12.0–15.0)
MCH: 30 pg (ref 26.0–34.0)
MCHC: 33 g/dL (ref 30.0–36.0)
MCV: 91.1 fL (ref 80.0–100.0)
Platelets: 215 10*3/uL (ref 150–400)
RBC: 3.83 MIL/uL — ABNORMAL LOW (ref 3.87–5.11)
RDW: 12.8 % (ref 11.5–15.5)
WBC: 13.6 10*3/uL — ABNORMAL HIGH (ref 4.0–10.5)
nRBC: 0 % (ref 0.0–0.2)

## 2022-04-20 LAB — BASIC METABOLIC PANEL
Anion gap: 8 (ref 5–15)
BUN: 17 mg/dL (ref 6–20)
CO2: 22 mmol/L (ref 22–32)
Calcium: 8.1 mg/dL — ABNORMAL LOW (ref 8.9–10.3)
Chloride: 105 mmol/L (ref 98–111)
Creatinine, Ser: 0.69 mg/dL (ref 0.44–1.00)
GFR, Estimated: >60 mL/min (ref >60)
Glucose, Bld: 118 mg/dL — ABNORMAL HIGH (ref 70–99)
Potassium: 3.9 mmol/L (ref 3.5–5.1)
Sodium: 135 mmol/L (ref 135–145)

## 2022-04-20 LAB — MAGNESIUM: Magnesium: 2.1 mg/dL (ref 1.7–2.4)

## 2022-04-20 NOTE — Progress Notes (Signed)
2 Days Post-Op  Subjective: CC: Reports she had a rough night. Reports she was doing well in am and in afternoon. Tolerating cld and passing flatus. In the evening began having more crampy abdominal pain, generalized, without associated bloating/distension, nausea and vomiting x2 overnight. No flatus this am. Mobilized in the halls yesterday. Voiding without difficulty.   Objective: Vital signs in last 24 hours: Temp:  [97.4 F (36.3 C)-98.6 F (37 C)] 97.4 F (36.3 C) (10/12 0547) Pulse Rate:  [56-63] 63 (10/12 0547) Resp:  [16-18] 16 (10/12 0547) BP: (102-118)/(61-74) 117/68 (10/12 0547) SpO2:  [98 %-100 %] 98 % (10/12 0547) Last BM Date : 04/14/22  Intake/Output from previous day: 10/11 0701 - 10/12 0700 In: 2737.9 [P.O.:1080; I.V.:1516.9; IV Piggyback:141] Out: 1171 [Urine:700; Emesis/NG output:1; Drains:470] Intake/Output this shift: No intake/output data recorded.  PE: Gen:  Alert, NAD, pleasant Card:  Reg Pulm:  CTAB, no W/R/R, effort normal Abd: Mild to moderate distension but soft. Appropriately tender around laparoscopic incisions, no rigidity or guarding and otherwise NT, Hypoactive BS. Incisions with glue intact appears well and are without drainage, bleeding, or signs of infection. Drain - cloudy serous fluid, 470cc/24 hours.  Ext:  No LE edema  Psych: A&Ox3    Lab Results:  Recent Labs    04/19/22 0402 04/20/22 0426  WBC 9.8 13.6*  HGB 11.0* 11.5*  HCT 34.5* 34.9*  PLT 178 215   BMET Recent Labs    04/19/22 1025 04/20/22 0426  NA 131* 135  K 4.1 3.9  CL 102 105  CO2 22 22  GLUCOSE 122* 118*  BUN 18 17  CREATININE 0.72 0.69  CALCIUM 8.2* 8.1*   PT/INR No results for input(s): "LABPROT", "INR" in the last 72 hours. CMP     Component Value Date/Time   NA 135 04/20/2022 0426   K 3.9 04/20/2022 0426   CL 105 04/20/2022 0426   CO2 22 04/20/2022 0426   GLUCOSE 118 (H) 04/20/2022 0426   BUN 17 04/20/2022 0426   CREATININE 0.69 04/20/2022  0426   CALCIUM 8.1 (L) 04/20/2022 0426   PROT 6.7 04/17/2022 2025   ALBUMIN 3.4 (L) 04/17/2022 2025   AST 25 04/17/2022 2025   ALT 22 04/17/2022 2025   ALKPHOS 68 04/17/2022 2025   BILITOT 0.9 04/17/2022 2025   GFRNONAA >60 04/20/2022 0426   Lipase     Component Value Date/Time   LIPASE 24 04/17/2022 2025    Studies/Results: No results found.  Anti-infectives: Anti-infectives (From admission, onward)    Start     Dose/Rate Route Frequency Ordered Stop   04/18/22 2200  piperacillin-tazobactam (ZOSYN) IVPB 3.375 g        3.375 g 12.5 mL/hr over 240 Minutes Intravenous Every 8 hours 04/18/22 1744     04/18/22 1000  piperacillin-tazobactam (ZOSYN) IVPB 3.375 g  Status:  Discontinued        3.375 g 12.5 mL/hr over 240 Minutes Intravenous Every 8 hours 04/18/22 0729 04/18/22 0851   04/18/22 0900  cefTRIAXone (ROCEPHIN) 2 g in sodium chloride 0.9 % 100 mL IVPB  Status:  Discontinued       See Hyperspace for full Linked Orders Report.   2 g 200 mL/hr over 30 Minutes Intravenous Every 24 hours 04/18/22 0839 04/18/22 1744   04/18/22 0900  metroNIDAZOLE (FLAGYL) IVPB 500 mg  Status:  Discontinued       See Hyperspace for full Linked Orders Report.   500 mg 100 mL/hr over 60  Minutes Intravenous Every 12 hours 04/18/22 0839 04/18/22 1744   04/18/22 0315  piperacillin-tazobactam (ZOSYN) IVPB 3.375 g        3.375 g 100 mL/hr over 30 Minutes Intravenous  Once 04/18/22 0313 04/18/22 9628        Assessment/Plan Appendicitis with perforation and abscess POD#2 s/p laparoscopic appendectomy 10/10 Dr. Marlou Starks - Appears she is developing an ileus, not unexpected. Make npo. If further n/v, will need xray and NGT. Monitor for ROBF - Continue JP and monitor output - cloudy/serous today - Continue IV antibiotics - Mobilize - Pulm toilet   ID - zosyn 10/10>> Afebrile, wbc up at 13.6 (9.8) FEN - IVF@125cc /hr, NPO VTE - SCDs, lovenox Foley - none, voiding without difficulty.    LOS: 1 day     Jillyn Ledger , [facility] 04/20/2022, 8:40 AM Please see Amion for pager number during day hours 7:00am-4:30pm

## 2022-04-20 NOTE — Progress Notes (Signed)
Patient's diet currently NPO from Clear liquids this AM. Patient is still nauseated, administering antiemetic medications. Patient has had one episode of emesis since my shift, 200 ml recorded.

## 2022-04-21 LAB — CBC
HCT: 37.2 % (ref 36.0–46.0)
Hemoglobin: 12 g/dL (ref 12.0–15.0)
MCH: 29.9 pg (ref 26.0–34.0)
MCHC: 32.3 g/dL (ref 30.0–36.0)
MCV: 92.8 fL (ref 80.0–100.0)
Platelets: 263 10*3/uL (ref 150–400)
RBC: 4.01 MIL/uL (ref 3.87–5.11)
RDW: 13 % (ref 11.5–15.5)
WBC: 10.5 10*3/uL (ref 4.0–10.5)
nRBC: 0 % (ref 0.0–0.2)

## 2022-04-21 LAB — BASIC METABOLIC PANEL
Anion gap: 8 (ref 5–15)
BUN: 19 mg/dL (ref 6–20)
CO2: 23 mmol/L (ref 22–32)
Calcium: 7.6 mg/dL — ABNORMAL LOW (ref 8.9–10.3)
Chloride: 107 mmol/L (ref 98–111)
Creatinine, Ser: 0.88 mg/dL (ref 0.44–1.00)
GFR, Estimated: >60 mL/min (ref >60)
Glucose, Bld: 85 mg/dL (ref 70–99)
Potassium: 3.4 mmol/L — ABNORMAL LOW (ref 3.5–5.1)
Sodium: 138 mmol/L (ref 135–145)

## 2022-04-21 LAB — SURGICAL PATHOLOGY

## 2022-04-21 NOTE — Progress Notes (Signed)
Mobility Specialist - Progress Note   04/21/22 1150  Mobility  Activity Ambulated independently in hallway  Level of Assistance Independent  Assistive Device None  Distance Ambulated (ft) 500 ft  Range of Motion/Exercises Active  Activity Response Tolerated well  Mobility Referral Yes  $Mobility charge 1 Mobility   Pt received in bed and agreeable to mobility. No complaints during mobility. Pt to bed after session with all needs met.    [facility]

## 2022-04-21 NOTE — Progress Notes (Signed)
3 Days Post-Op   Subjective/Chief Complaint: Feels better today. Less nausea   Objective: Vital signs in last 24 hours: Temp:  [97.9 F (36.6 C)-98.6 F (37 C)] 97.9 F (36.6 C) (10/13 0550) Pulse Rate:  [60-64] 62 (10/13 0550) Resp:  [16-18] 18 (10/13 0550) BP: (118-128)/(65-71) 128/71 (10/13 0550) SpO2:  [98 %-100 %] 98 % (10/13 0550) Last BM Date : 04/15/22  Intake/Output from previous day: 10/12 0701 - 10/13 0700 In: 3127.3 [P.O.:360; I.V.:2617.3; IV Piggyback:150.1] Out: 2360 [Urine:1050; Emesis/NG output:200; Drains:1110] Intake/Output this shift: Total I/O In: -  Out: 125 [Drains:125]  General appearance: alert and cooperative Resp: clear to auscultation bilaterally Cardio: regular rate and rhythm GI: soft, mild tenderness. Good bs. Drain output serous  Lab Results:  Recent Labs    04/20/22 0426 04/21/22 0409  WBC 13.6* 10.5  HGB 11.5* 12.0  HCT 34.9* 37.2  PLT 215 263   BMET Recent Labs    04/20/22 0426 04/21/22 0409  NA 135 138  K 3.9 3.4*  CL 105 107  CO2 22 23  GLUCOSE 118* 85  BUN 17 19  CREATININE 0.69 0.88  CALCIUM 8.1* 7.6*   PT/INR No results for input(s): "LABPROT", "INR" in the last 72 hours. ABG No results for input(s): "PHART", "HCO3" in the last 72 hours.  Invalid input(s): "PCO2", "PO2"  Studies/Results: No results found.  Anti-infectives: Anti-infectives (From admission, onward)    Start     Dose/Rate Route Frequency Ordered Stop   04/18/22 2200  piperacillin-tazobactam (ZOSYN) IVPB 3.375 g        3.375 g 12.5 mL/hr over 240 Minutes Intravenous Every 8 hours 04/18/22 1744     04/18/22 1000  piperacillin-tazobactam (ZOSYN) IVPB 3.375 g  Status:  Discontinued        3.375 g 12.5 mL/hr over 240 Minutes Intravenous Every 8 hours 04/18/22 0729 04/18/22 0851   04/18/22 0900  cefTRIAXone (ROCEPHIN) 2 g in sodium chloride 0.9 % 100 mL IVPB  Status:  Discontinued       See Hyperspace for full Linked Orders Report.   2 g 200  mL/hr over 30 Minutes Intravenous Every 24 hours 04/18/22 0839 04/18/22 1744   04/18/22 0900  metroNIDAZOLE (FLAGYL) IVPB 500 mg  Status:  Discontinued       See Hyperspace for full Linked Orders Report.   500 mg 100 mL/hr over 60 Minutes Intravenous Every 12 hours 04/18/22 0839 04/18/22 1744   04/18/22 0315  piperacillin-tazobactam (ZOSYN) IVPB 3.375 g        3.375 g 100 mL/hr over 30 Minutes Intravenous  Once 04/18/22 0313 04/18/22 0509       Assessment/Plan: s/p Procedure(s): APPENDECTOMY LAPAROSCOPIC (N/A) Advance diet. Start clears today Continue IV zosyn Ambulate Ileus seems to be improving Appendicitis with perforation and abscess POD#3 s/p laparoscopic appendectomy 10/10 Dr. Marlou Starks - ileus improving, not unexpected.  - Continue JP and monitor output - cloudy/serous today - Continue IV antibiotics - Mobilize - Pulm toilet   ID - zosyn 10/10>> Afebrile, wbc down to 10.5 FEN - IVF@125cc /hr, NPO VTE - SCDs, lovenox Foley - none, voiding without difficulty.   LOS: 2 days    Victoria Mcintyre 04/21/2022

## 2022-04-22 MED ORDER — TRAMADOL HCL 50 MG PO TABS
50.0000 mg | ORAL_TABLET | Freq: Four times a day (QID) | ORAL | Status: DC | PRN
  Administered 2022-04-22 (×2): 50 mg via ORAL
  Filled 2022-04-22 (×2): qty 1

## 2022-04-22 NOTE — Progress Notes (Signed)
4 Days Post-Op   Subjective/Chief Complaint: Feeling pretty well, but no bowel function yet and had another episode of emesis overnight   Objective: Vital signs in last 24 hours: Temp:  [98.3 F (36.8 C)-98.7 F (37.1 C)] 98.5 F (36.9 C) (10/14 0555) Pulse Rate:  [62-69] 69 (10/14 0555) Resp:  [14-16] 16 (10/14 0555) BP: (116-130)/(61-66) 116/61 (10/14 0555) SpO2:  [96 %-99 %] 96 % (10/14 0555) Last BM Date : 04/15/22  Intake/Output from previous day: 10/13 0701 - 10/14 0700 In: 2749.8 [P.O.:700; I.V.:1907.6; IV Piggyback:142.2] Out: 2255 [Urine:500; Emesis/NG output:1200; Drains:555] Intake/Output this shift: Total I/O In: -  Out: 80 [Drains:80]  General appearance: alert and cooperative Resp: clear to auscultation bilaterally Cardio: regular rate and rhythm GI: soft, mild tenderness.  Incisions clean, dry and intact.  Drain output completely serous but fairly high-volume (555 recorded over 24 hours)  Lab Results:  Recent Labs    04/20/22 0426 04/21/22 0409  WBC 13.6* 10.5  HGB 11.5* 12.0  HCT 34.9* 37.2  PLT 215 263    BMET Recent Labs    04/20/22 0426 04/21/22 0409  NA 135 138  K 3.9 3.4*  CL 105 107  CO2 22 23  GLUCOSE 118* 85  BUN 17 19  CREATININE 0.69 0.88  CALCIUM 8.1* 7.6*    PT/INR No results for input(s): "LABPROT", "INR" in the last 72 hours. ABG No results for input(s): "PHART", "HCO3" in the last 72 hours.  Invalid input(s): "PCO2", "PO2"  Studies/Results: No results found.  Anti-infectives: Anti-infectives (From admission, onward)    Start     Dose/Rate Route Frequency Ordered Stop   04/18/22 2200  piperacillin-tazobactam (ZOSYN) IVPB 3.375 g        3.375 g 12.5 mL/hr over 240 Minutes Intravenous Every 8 hours 04/18/22 1744     04/18/22 1000  piperacillin-tazobactam (ZOSYN) IVPB 3.375 g  Status:  Discontinued        3.375 g 12.5 mL/hr over 240 Minutes Intravenous Every 8 hours 04/18/22 0729 04/18/22 0851   04/18/22 0900   cefTRIAXone (ROCEPHIN) 2 g in sodium chloride 0.9 % 100 mL IVPB  Status:  Discontinued       See Hyperspace for full Linked Orders Report.   2 g 200 mL/hr over 30 Minutes Intravenous Every 24 hours 04/18/22 0839 04/18/22 1744   04/18/22 0900  metroNIDAZOLE (FLAGYL) IVPB 500 mg  Status:  Discontinued       See Hyperspace for full Linked Orders Report.   500 mg 100 mL/hr over 60 Minutes Intravenous Every 12 hours 04/18/22 0839 04/18/22 1744   04/18/22 0315  piperacillin-tazobactam (ZOSYN) IVPB 3.375 g        3.375 g 100 mL/hr over 30 Minutes Intravenous  Once 04/18/22 0313 04/18/22 0509       Assessment/Plan: s/p Procedure(s): APPENDECTOMY LAPAROSCOPIC (N/A) Advance diet.  Stick with clears today, patient advised to try chewing gum or hard candies to help with ileus.  We will cut back IV fluids. Continue IV zosyn, recheck labs tomorrow Ambulate Ileus seems to be improving Appendicitis with perforation and abscess POD#4 s/p laparoscopic appendectomy 10/10 Dr. Marlou Starks - ileus improving, not unexpected.  - Continue JP and monitor output -volume but serous today - Continue IV antibiotics - Mobilize - Pulm toilet   ID - zosyn 10/10>> Afebrile, wbc down to 10.5 yesterday FEN - IVF@125cc /hr, NPO VTE - SCDs, lovenox Foley - none, voiding without difficulty.    LOS: 3 days    Victoria Mcintyre A  Victoria Mcintyre 04/22/2022

## 2022-04-23 LAB — BASIC METABOLIC PANEL
Anion gap: 5 (ref 5–15)
BUN: 13 mg/dL (ref 6–20)
CO2: 26 mmol/L (ref 22–32)
Calcium: 7.9 mg/dL — ABNORMAL LOW (ref 8.9–10.3)
Chloride: 108 mmol/L (ref 98–111)
Creatinine, Ser: 0.68 mg/dL (ref 0.44–1.00)
GFR, Estimated: >60 mL/min (ref >60)
Glucose, Bld: 101 mg/dL — ABNORMAL HIGH (ref 70–99)
Potassium: 3.5 mmol/L (ref 3.5–5.1)
Sodium: 139 mmol/L (ref 135–145)

## 2022-04-23 LAB — CBC
HCT: 35 % — ABNORMAL LOW (ref 36.0–46.0)
Hemoglobin: 11.3 g/dL — ABNORMAL LOW (ref 12.0–15.0)
MCH: 30.1 pg (ref 26.0–34.0)
MCHC: 32.3 g/dL (ref 30.0–36.0)
MCV: 93.1 fL (ref 80.0–100.0)
Platelets: 319 10*3/uL (ref 150–400)
RBC: 3.76 MIL/uL — ABNORMAL LOW (ref 3.87–5.11)
RDW: 13.2 % (ref 11.5–15.5)
WBC: 9.8 10*3/uL (ref 4.0–10.5)
nRBC: 0 % (ref 0.0–0.2)

## 2022-04-23 LAB — CREATININE, FLUID (PLEURAL, PERITONEAL, JP DRAINAGE): Creat, Fluid: 0.6 mg/dL

## 2022-04-23 LAB — MAGNESIUM: Magnesium: 2 mg/dL (ref 1.7–2.4)

## 2022-04-23 NOTE — Progress Notes (Addendum)
5 Days Post-Op   Subjective/Chief Complaint: Had a good day yesterday, no nausea or emesis. Having some flatus now and a tiny bowel movement.    Objective: Vital signs in last 24 hours: Temp:  [97.8 F (36.6 C)-98.3 F (36.8 C)] 97.8 F (36.6 C) (10/15 0631) Pulse Rate:  [58-70] 61 (10/15 0631) Resp:  [16-18] 16 (10/14 2157) BP: (118-126)/(66-72) 118/67 (10/15 0631) SpO2:  [98 %] 98 % (10/15 0631) Last BM Date : 04/15/22  Intake/Output from previous day: 10/14 0701 - 10/15 0700 In: 2662.9 [P.O.:600; I.V.:1916.1; IV Piggyback:146.9] Out: 2140 [Urine:1500; Drains:640] Intake/Output this shift: Total I/O In: -  Out: 70 [Drains:70]  General appearance: alert and cooperative Resp: clear to auscultation bilaterally Cardio: regular rate and rhythm GI: soft, mild tenderness.  Incisions clean, dry and intact.  Drain output completely serous but remains fairly high-volume (640 recorded over 24 hours) GU- reports slightly dark but normal volume urine output, no hematuria  Lab Results:  Recent Labs    04/21/22 0409 04/23/22 0425  WBC 10.5 9.8  HGB 12.0 11.3*  HCT 37.2 35.0*  PLT 263 319   BMET Recent Labs    04/21/22 0409 04/23/22 0425  NA 138 139  K 3.4* 3.5  CL 107 108  CO2 23 26  GLUCOSE 85 101*  BUN 19 13  CREATININE 0.88 0.68  CALCIUM 7.6* 7.9*   PT/INR No results for input(s): "LABPROT", "INR" in the last 72 hours. ABG No results for input(s): "PHART", "HCO3" in the last 72 hours.  Invalid input(s): "PCO2", "PO2"  Studies/Results: No results found.  Anti-infectives: Anti-infectives (From admission, onward)    Start     Dose/Rate Route Frequency Ordered Stop   04/18/22 2200  piperacillin-tazobactam (ZOSYN) IVPB 3.375 g        3.375 g 12.5 mL/hr over 240 Minutes Intravenous Every 8 hours 04/18/22 1744     04/18/22 1000  piperacillin-tazobactam (ZOSYN) IVPB 3.375 g  Status:  Discontinued        3.375 g 12.5 mL/hr over 240 Minutes Intravenous Every 8  hours 04/18/22 0729 04/18/22 0851   04/18/22 0900  cefTRIAXone (ROCEPHIN) 2 g in sodium chloride 0.9 % 100 mL IVPB  Status:  Discontinued       See Hyperspace for full Linked Orders Report.   2 g 200 mL/hr over 30 Minutes Intravenous Every 24 hours 04/18/22 0839 04/18/22 1744   04/18/22 0900  metroNIDAZOLE (FLAGYL) IVPB 500 mg  Status:  Discontinued       See Hyperspace for full Linked Orders Report.   500 mg 100 mL/hr over 60 Minutes Intravenous Every 12 hours 04/18/22 0839 04/18/22 1744   04/18/22 0315  piperacillin-tazobactam (ZOSYN) IVPB 3.375 g        3.375 g 100 mL/hr over 30 Minutes Intravenous  Once 04/18/22 0313 04/18/22 0509       Assessment/Plan: s/p Procedure(s): APPENDECTOMY LAPAROSCOPIC (N/A) Advance diet.  Stick with clears today, patient advised to try chewing gum or hard candies to help with ileus.  Will stop IVG, try full liquids Continue IV zosyn, recheck labs tomorrow, check drain creatinine just to be sure given high volume.  Ambulate Ileus seems to be improving Appendicitis with perforation and abscess POD#5 s/p laparoscopic appendectomy 10/10 Dr. Marlou Starks - ileus improving, not unexpected.  - Continue JP and monitor output -volume but serous today - Continue IV antibiotics - Mobilize - Pulm toilet   ID - zosyn 10/10>> Afebrile, wbc normal FEN - fld VTE - SCDs,  lovenox Foley - none, voiding without difficulty.    LOS: 4 days    Victoria Mcintyre 04/23/2022

## 2022-04-24 NOTE — Discharge Instructions (Signed)
[facility]  Please arrive at least 30 min before your appointment to complete your check in paperwork.  If you are unable to arrive 30 min prior to your appointment time we may have to cancel or reschedule you. LAPAROSCOPIC SURGERY: POST OP INSTRUCTIONS Always review your discharge instruction sheet given to you by the facility where your surgery was performed. IF YOU HAVE DISABILITY OR FAMILY LEAVE FORMS, YOU MUST BRING THEM TO THE OFFICE FOR PROCESSING.   DO NOT GIVE THEM TO YOUR DOCTOR.  PAIN CONTROL  First take acetaminophen (Tylenol) AND/or ibuprofen (Advil) to control your pain after surgery.  Follow directions on package.  Taking acetaminophen (Tylenol) and/or ibuprofen (Advil) regularly after surgery will help to control your pain and lower the amount of prescription pain medication you may need.  You should not take more than 4,000 mg (4 grams) of acetaminophen (Tylenol) in 24 hours.  You should not take ibuprofen (Advil), aleve, motrin, naprosyn or other NSAIDS if you have a history of stomach ulcers or chronic kidney disease.  A prescription for pain medication may be given to you upon discharge.  Take your pain medication as prescribed, if you still have uncontrolled pain after taking acetaminophen (Tylenol) or ibuprofen (Advil). Use ice packs to help control pain. If you need a refill on your pain medication, please contact your pharmacy.  They will contact our office to request authorization. Prescriptions will not be filled after 5pm or on week-ends.  HOME MEDICATIONS Take your usually prescribed medications unless otherwise directed.  DIET You should follow a light diet the first few days after arrival home.  Be sure to include lots of fluids daily. Avoid fatty, fried foods.   CONSTIPATION It is common to experience some constipation after surgery and if you are taking pain medication.  Increasing fluid intake and taking a stool softener (such as Colace)  will usually help or prevent this problem from occurring.  A mild laxative (Milk of Magnesia or Miralax) should be taken according to package instructions if there are no bowel movements after 48 hours.  WOUND/INCISION CARE Most patients will experience some swelling and bruising in the area of the incisions.  Ice packs will help.  Swelling and bruising can take several days to resolve.  Unless discharge instructions indicate otherwise, follow guidelines below  STERI-STRIPS - you may remove your outer bandages 48 hours after surgery, and you may shower at that time.  You have steri-strips (small skin tapes) in place directly over the incision.  These strips should be left on the skin for 7-10 days.   DERMABOND/SKIN GLUE - you may shower in 24 hours.  The glue will flake off over the next 2-3 weeks. Any sutures or staples will be removed at the office during your follow-up visit.  ACTIVITIES You may resume regular (light) daily activities beginning the next day--such as daily self-care, walking, climbing stairs--gradually increasing activities as tolerated.  You may have sexual intercourse when it is comfortable.  Refrain from any heavy lifting or straining until approved by your doctor. You may drive when you are no longer taking prescription pain medication, you can comfortably wear a seatbelt, and you can safely maneuver your car and apply brakes.  FOLLOW-UP You should see your doctor in the office for a follow-up appointment approximately 2-3 weeks after your surgery.  You should have been given your post-op/follow-up appointment when your surgery was scheduled.  If you did not receive a post-op/follow-up appointment, make sure   that you call for this appointment within a day or two after you arrive home to insure a convenient appointment time.   WHEN TO CALL YOUR DOCTOR: Fever over 101.0 Inability to urinate Continued bleeding from incision. Increased pain, redness, or drainage from the  incision. Increasing abdominal pain  The clinic staff is available to answer your questions during regular business hours.  Please don't hesitate to call and ask to speak to one of the nurses for clinical concerns.  If you have a medical emergency, go to the nearest emergency room or call 911.  A surgeon from [facility] is always on call at the hospital. [address], [address], [place], [place]  [address] ? [address] [address], [place], [place]   [address] [phone] ? [phone] ? FAX [phone]  

## 2022-04-24 NOTE — Progress Notes (Addendum)
6 Days Post-Op  Subjective: CC: Doing well. Some nasuea last night requiring zofran but thinks this was more just her being hungry. No nausea since then. Tolerating fld without vomiting. No abdominal pain. No prn pain meds. Passing flatus. More formed bm today. Voiding without difficulty. Mobilizing well.   Objective: Vital signs in last 24 hours: Temp:  [97.6 F (36.4 C)-98.5 F (36.9 C)] 98.4 F (36.9 C) (10/16 0555) Pulse Rate:  [57-61] 59 (10/16 0555) Resp:  [16] 16 (10/16 0555) BP: (119-128)/(66-72) 119/72 (10/16 0555) SpO2:  [99 %-100 %] 99 % (10/16 0555) Last BM Date : 04/23/22  Intake/Output from previous day: 10/15 0701 - 10/16 0700 In: 1001 [P.O.:840; IV Piggyback:161] Out: 1840 [Urine:1400; Drains:440] Intake/Output this shift: Total I/O In: -  Out: 20 [Drains:20]  PE: Gen:  Alert, NAD, pleasant Card:  Reg Pulm:  CTAB, no W/R/R, effort normal Abd: Soft, non distension, appropriately tender around laparoscopic incisions, no rigidity or guarding and otherwise NT, +BS. Incisions with gauze and tegaderm in place - cdi. Drain serous, 440cc/ 24 hours.  Ext:  No LE edema  Psych: A&Ox3   Lab Results:  Recent Labs    04/23/22 0425  WBC 9.8  HGB 11.3*  HCT 35.0*  PLT 319   BMET Recent Labs    04/23/22 0425  NA 139  K 3.5  CL 108  CO2 26  GLUCOSE 101*  BUN 13  CREATININE 0.68  CALCIUM 7.9*   PT/INR No results for input(s): "LABPROT", "INR" in the last 72 hours. CMP     Component Value Date/Time   NA 139 04/23/2022 0425   K 3.5 04/23/2022 0425   CL 108 04/23/2022 0425   CO2 26 04/23/2022 0425   GLUCOSE 101 (H) 04/23/2022 0425   BUN 13 04/23/2022 0425   CREATININE 0.68 04/23/2022 0425   CALCIUM 7.9 (L) 04/23/2022 0425   PROT 6.7 04/17/2022 2025   ALBUMIN 3.4 (L) 04/17/2022 2025   AST 25 04/17/2022 2025   ALT 22 04/17/2022 2025   ALKPHOS 68 04/17/2022 2025   BILITOT 0.9 04/17/2022 2025   GFRNONAA >60 04/23/2022 0425   Lipase      Component Value Date/Time   LIPASE 24 04/17/2022 2025    Studies/Results: No results found.  Anti-infectives: Anti-infectives (From admission, onward)    Start     Dose/Rate Route Frequency Ordered Stop   04/18/22 2200  piperacillin-tazobactam (ZOSYN) IVPB 3.375 g        3.375 g 12.5 mL/hr over 240 Minutes Intravenous Every 8 hours 04/18/22 1744     04/18/22 1000  piperacillin-tazobactam (ZOSYN) IVPB 3.375 g  Status:  Discontinued        3.375 g 12.5 mL/hr over 240 Minutes Intravenous Every 8 hours 04/18/22 0729 04/18/22 0851   04/18/22 0900  cefTRIAXone (ROCEPHIN) 2 g in sodium chloride 0.9 % 100 mL IVPB  Status:  Discontinued       See Hyperspace for full Linked Orders Report.   2 g 200 mL/hr over 30 Minutes Intravenous Every 24 hours 04/18/22 0839 04/18/22 1744   04/18/22 0900  metroNIDAZOLE (FLAGYL) IVPB 500 mg  Status:  Discontinued       See Hyperspace for full Linked Orders Report.   500 mg 100 mL/hr over 60 Minutes Intravenous Every 12 hours 04/18/22 0839 04/18/22 1744   04/18/22 0315  piperacillin-tazobactam (ZOSYN) IVPB 3.375 g        3.375 g 100 mL/hr over 30 Minutes Intravenous  Once  04/18/22 0313 04/18/22 0509        Assessment/Plan Appendicitis with perforation and abscess POD#6 s/p laparoscopic appendectomy 10/10 Dr. Marlou Starks - [place] - serous. Suspect can be d/c'd prior to d/c, will discuss with MD - Continue IV antibiotics for now. Will discuss with MD duration. WBC wnl on last check, afebrile, clinically improving, drain characteristics reassuring.  - Mobilize - Pulm toilet - Path with acute appendicitis with focal, transmural, necrosis and serositis. No prior csc, scheduled for first csc in march.  - Will check this pm for possible dc   ID - zosyn 10/10>> Afebrile, wbc normal FEN - Soft, shakes, d/c IVF VTE - SCDs, lovenox Foley - none, voiding without difficulty.      LOS: 5 days    Jillyn Ledger , [facility]  Surgery 04/24/2022, 8:48 AM Please see Amion for pager number during day hours 7:00am-4:30pm

## 2022-04-25 LAB — CBC
HCT: 36 % (ref 36.0–46.0)
Hemoglobin: 11.6 g/dL — ABNORMAL LOW (ref 12.0–15.0)
MCH: 29.8 pg (ref 26.0–34.0)
MCHC: 32.2 g/dL (ref 30.0–36.0)
MCV: 92.5 fL (ref 80.0–100.0)
Platelets: 391 10*3/uL (ref 150–400)
RBC: 3.89 MIL/uL (ref 3.87–5.11)
RDW: 13.2 % (ref 11.5–15.5)
WBC: 9 10*3/uL (ref 4.0–10.5)
nRBC: 0 % (ref 0.0–0.2)

## 2022-04-25 MED ORDER — TRAMADOL HCL 50 MG PO TABS: 50.0000 mg | ORAL_TABLET | Freq: Four times a day (QID) | ORAL | 0 refills | Status: DC | PRN

## 2022-04-25 MED ORDER — METHOCARBAMOL 500 MG PO TABS: 500.0000 mg | ORAL_TABLET | Freq: Three times a day (TID) | ORAL | 0 refills | Status: DC | PRN

## 2022-04-25 MED ORDER — POLYETHYLENE GLYCOL 3350 17 G PO PACK: 17.0000 g | PACK | Freq: Every day | ORAL | 0 refills | Status: DC | PRN

## 2022-04-25 MED ORDER — ACETAMINOPHEN 500 MG PO TABS: 1000.0000 mg | ORAL_TABLET | Freq: Three times a day (TID) | ORAL | 0 refills | Status: DC | PRN

## 2022-04-25 NOTE — Discharge Summary (Signed)
Patient ID: Victoria Mcintyre [id number] Feb 21, 1976 46 y.o.  Admit date: 04/18/2022 Discharge date: 04/25/2022  Discharge Diagnosis S/p laparoscopic appendectomy 10/10 Dr. Carolynne Edouard for appendicitis with perforation and abscess  Consultants None  Reason for Admission: Victoria Mcintyre is a 46yo female PMH anxiety/depression who presented to [facility] last night complaining of worsening abdominal pain. States that the pain started 4 days ago on Friday and was initially generalized. Associated with nausea and vomiting. Pain somewhat improved for a couple days, then became worse again today. Pain is now mostly in the lower quadrants. She has had low grade temps. In the ED patient was found to be tachycardic with TMAX 100.6, BP ok. WBC 12. CT scan abdomen/ pelvis shows acute appendicitis with small amount of extraluminal gas anterior to the distal appendix, consistent with small perforation, no discrete collection. General surgery asked to see.   No prior h/o abdominal surgery Nonsmoker Drinks alcohol socially on the weekends Denies illicit drug use Not currently working NKDA  Procedures Dr. Carolynne Edouard - 04/18/22 - laparoscopic appendectomy  Hospital Course:  Patient presented as above and was found to have acute appendicitis.  Was taken to the OR by Dr. Carolynne Edouard on 10/10 for laparoscopic appendectomy.  Intra-Op a large amount of pus was identified in the RLQ and a drain was left in the pelvis postop.  Patient remained on antibiotics postop.  She developed a mild ileus that resolved.  Her diet was advanced and tolerated.  She did have large volume output from her JP drain but it was serious.  Drain creatinine was checked and okay.  Drain remained serous and was able to be removed prior to discharge. WBC normalized. On POD 7, the patient was voiding well, tolerating diet, ambulating well, pain well controlled, vital signs stable, incisions c/d/i and felt stable for discharge home. No further abx prescribed at  d/c. She reports she does not need a note for work. Discussed discharge instructions, restrictions and return/call back precautions. Follow up as arranged below.   To note patient has not had a prior colonoscopy before.  She reports she is scheduled for her first colonoscopy in the next few months. Path with acute appendicitis with focal, transmural, necrosis and serositis.  Physical Exam: Gen:  Alert, NAD, pleasant Card:  Reg Pulm:  CTAB, no W/R/R, effort normal Abd: Soft, ND, appropriately tender around laparoscopic incisions, no rigidity or guarding and otherwise NT, +BS. Incisions cdi. Drain serous Ext:  No LE edema  Psych: A&Ox3   Allergies as of 04/25/2022   No Known Allergies      Medication List     TAKE these medications    acetaminophen 500 MG tablet Commonly known as: TYLENOL Take 2 tablets (1,000 mg total) by mouth every 8 (eight) hours as needed.   clobetasol ointment 0.05 % Commonly known as: TEMOVATE Use small amount 2 times day for flare.  Do not use for more than 5 days each month.   levonorgestrel-ethinyl estradiol 0.1-20 MG-MCG tablet Commonly known as: ALESSE Take 1 tablet by mouth daily.   methocarbamol 500 MG tablet Commonly known as: ROBAXIN Take 1 tablet (500 mg total) by mouth every 8 (eight) hours as needed for muscle spasms.   ondansetron 8 MG tablet Commonly known as: ZOFRAN Take 8 mg by mouth 3 (three) times daily.   polyethylene glycol 17 g packet Commonly known as: MIRALAX / GLYCOLAX Take 17 g by mouth daily as needed for mild constipation.   sertraline 25 MG tablet Commonly  known as: ZOLOFT Take 25 mg by mouth daily.   traMADol 50 MG tablet Commonly known as: ULTRAM Take 1 tablet (50 mg total) by mouth every 6 (six) hours as needed (breakthrough pain).          Follow-up Information     Surgery, [place]. Call on 05/16/2022.   Specialty: General Surgery Why: 11/07 at 9:15 am. Please bring a copy of your photo ID and  insurance card. Please arrive 30 minutes prior to your appointment for paperwork. Contact information: [address] [place] [address] [phone]                 Signed: Alferd Apa, [facility] 04/25/2022, 9:08 AM Please see Amion for pager number during day hours 7:00am-4:30pm

## 2022-04-25 NOTE — Progress Notes (Signed)
Discharge instructions given to patient and all questions were answered.  

## 2022-05-01 ENCOUNTER — Ambulatory Visit (HOSPITAL_BASED_OUTPATIENT_CLINIC_OR_DEPARTMENT_OTHER): Payer: 59 | Admitting: Obstetrics & Gynecology

## 2022-08-31 ENCOUNTER — Encounter (HOSPITAL_BASED_OUTPATIENT_CLINIC_OR_DEPARTMENT_OTHER): Payer: Self-pay | Admitting: Obstetrics & Gynecology

## 2022-08-31 ENCOUNTER — Ambulatory Visit (INDEPENDENT_AMBULATORY_CARE_PROVIDER_SITE_OTHER): Payer: 59 | Admitting: Obstetrics & Gynecology

## 2022-08-31 VITALS — BP 114/63 | HR 57 | Ht 67.5 in | Wt 168.2 lb

## 2022-08-31 DIAGNOSIS — Z1211 Encounter for screening for malignant neoplasm of colon: Secondary | ICD-10-CM

## 2022-08-31 DIAGNOSIS — Z3041 Encounter for surveillance of contraceptive pills: Secondary | ICD-10-CM | POA: Diagnosis not present

## 2022-08-31 DIAGNOSIS — N763 Subacute and chronic vulvitis: Secondary | ICD-10-CM

## 2022-08-31 DIAGNOSIS — Z01419 Encounter for gynecological examination (general) (routine) without abnormal findings: Secondary | ICD-10-CM

## 2022-08-31 MED ORDER — LEVONORGESTREL-ETHINYL ESTRAD 0.1-20 MG-MCG PO TABS: 1.0000 | ORAL_TABLET | Freq: Every day | ORAL | 3 refills | Status: DC

## 2022-08-31 MED ORDER — CLOBETASOL PROPIONATE 0.05 % EX OINT: TOPICAL_OINTMENT | CUTANEOUS | 1 refills | Status: DC

## 2022-08-31 NOTE — Progress Notes (Signed)
47 y.o. G30P1001 Married White or Caucasian female here for annual exam.  Doing well.  Had an appendectomy due to ruptured appendix.  Had a 9 day hospital stay.  Had a post op ileus.  Dr. Jabier Mutton did her surgery.  On OCPs.  Flow lasts 4-5 with her cycles.    Does use clobetasol a few days a month.  Needs RF.          Sexually active: Yes.    The current method of family planning is OCP (estrogen/progesterone).    Smoker:  no  Health Maintenance: Pap:  04/27/2021 Negative History of abnormal Pap:  no MMG:  05/21/2018 Negative Colonoscopy:  guidelines reviewed Screening Labs: 07/2021   reports that she has never smoked. She has never used smokeless tobacco. She reports current alcohol use of about 4.0 standard drinks of alcohol per week. She reports that she does not use drugs.  Past Medical History:  Diagnosis Date   Anxiety    Depression    Family history of adverse reaction to anesthesia    Lichen sclerosus et atrophicus of the vulva 12/2007    Past Surgical History:  Procedure Laterality Date   LAPAROSCOPIC APPENDECTOMY N/A 04/18/2022   Procedure: APPENDECTOMY LAPAROSCOPIC;  Surgeon: [name], MD;  Location: [facility];  Service: General;  Laterality: N/A;   WISDOM TOOTH EXTRACTION Bilateral 2002    Current Outpatient Medications  Medication Sig Dispense Refill   ondansetron (ZOFRAN) 8 MG tablet Take 8 mg by mouth 3 (three) times daily.     sertraline (ZOLOFT) 25 MG tablet Take 25 mg by mouth daily.     clobetasol ointment (TEMOVATE) 0.05 % Use small amount 2 times day for flare.  Do not use for more than 5 days each month. 30 g 1   levonorgestrel-ethinyl estradiol (ALESSE) 0.1-20 MG-MCG tablet Take 1 tablet by mouth daily. 84 tablet 3   No current facility-administered medications for this visit.    Family History  Problem Relation Age of Onset   Osteoarthritis Father    Heart disease Father    Seizures Father    Autoimmune disease Father    Asthma Brother    Heart  failure Paternal Grandfather    Breast cancer Paternal Grandmother    Heart failure Paternal Grandmother    Cancer Paternal Grandmother        pancreatic   Diabetes Paternal Grandmother    Cancer Paternal Aunt        melanoma    ROS: Constitutional: negative Genitourinary:negative  Exam:   BP 114/63 (BP Location: Right Arm, Patient Position: Sitting, Cuff Size: Large)   Pulse (!) 57   Ht 5' 7.5" (1.715 m) Comment: Reported  Wt 168 lb 3.2 oz (76.3 kg)   LMP 08/31/2022   BMI 25.96 kg/m   Height: 5' 7.5" (171.5 cm) (Reported)  General appearance: alert, cooperative and appears stated age Head: Normocephalic, without obvious abnormality, atraumatic Neck: no adenopathy, supple, symmetrical, trachea midline and thyroid normal to inspection and palpation Lungs: clear to auscultation bilaterally Breasts: normal appearance, no masses or tenderness Heart: regular rate and rhythm Abdomen: soft, non-tender; bowel sounds normal; no masses,  no organomegaly Extremities: extremities normal, atraumatic, no cyanosis or edema Skin: Skin color, texture, turgor normal. No rashes or lesions Lymph nodes: Cervical, supraclavicular, and axillary nodes normal. No abnormal inguinal nodes palpated Neurologic: Grossly normal   Pelvic: External genitalia:  no lesions  Urethra:  normal appearing urethra with no masses, tenderness or lesions              Bartholins and Skenes: normal                 Vagina: normal appearing vagina with normal color and no discharge, no lesions              Cervix: no lesions              Pap taken: No. Bimanual Exam:  Uterus:  normal size, contour, position, consistency, mobility, non-tender              Adnexa: normal adnexa and no mass, fullness, tenderness               Rectovaginal: Confirms               Anus:  normal sphincter tone, no lesions  Chaperone, Octaviano Batty, CMA, was present for exam.  Assessment/Plan: 1. Well woman exam with routine  gynecological exam - Pap smear neg 04/2021 - Mammogram 03/2022 - Colonoscopy guidelines reviewed - lab work done with PCP, Dr. Melford Aase - vaccines reviewed/updated  2. Encounter for surveillance of contraceptive pills - levonorgestrel-ethinyl estradiol (ALESSE) 0.1-20 MG-MCG tablet; Take 1 tablet by mouth daily.  Dispense: 84 tablet; Refill: 3  3. Chronic vulvitis - clobetasol ointment (TEMOVATE) 0.05 %; Use small amount 2 times day for flare.  Do not use for more than 5 days each month.  Dispense: 30 g; Refill: 1  4. Colon cancer screening - Ambulatory referral to Gastroenterology

## 2022-10-10 ENCOUNTER — Encounter: Payer: Self-pay | Admitting: Gastroenterology

## 2022-10-27 ENCOUNTER — Encounter: Payer: Self-pay | Admitting: Gastroenterology

## 2022-10-27 ENCOUNTER — Ambulatory Visit (AMBULATORY_SURGERY_CENTER): Payer: 59

## 2022-10-27 VITALS — Ht 67.5 in | Wt 164.0 lb

## 2022-10-27 DIAGNOSIS — Z1211 Encounter for screening for malignant neoplasm of colon: Secondary | ICD-10-CM

## 2022-10-27 MED ORDER — NA SULFATE-K SULFATE-MG SULF 17.5-3.13-1.6 GM/177ML PO SOLN: 1.0000 | Freq: Once | ORAL | 0 refills | Status: AC

## 2022-10-27 NOTE — Progress Notes (Signed)
No egg or soy allergy known to patient  No issues known to pt with past sedation with any surgeries or procedures Patient denies ever being told they had issues or difficulty with intubation  No FH of Malignant Hyperthermia Pt is not on diet pills Pt is not on  home 02  Pt is not on blood thinners  Pt denies issues with constipation  No A fib or A flutter Have any cardiac testing pending--no  Pt reports full mobility and movement  Prep instructions reviewed and sent via mychart.  Pt understands if her prep medications comes to be to costly to call our office back to have her pharmacy switched to use goodrx coupon  Pt instructed to use Single[employer]com or GoodRx for a price reduction on prep   Patient's chart reviewed by Cathlyn Parsons CNRA prior to previsit and patient appropriate for the [facility].  Previsit completed and red dot placed by patient's name on their procedure day (on provider's schedule).

## 2022-11-21 ENCOUNTER — Encounter: Payer: 59 | Admitting: Gastroenterology

## 2022-12-13 ENCOUNTER — Telehealth: Payer: Self-pay | Admitting: Gastroenterology

## 2022-12-13 NOTE — Telephone Encounter (Signed)
Patient is calling states she ate about 2 tablespoons of nuts yesterday, not sure what to do given her procedure is on Friday 6/7. Please advise

## 2022-12-13 NOTE — Telephone Encounter (Signed)
[place] with pt who stated she ate about a handful "small" one of nuts last night and has her procedure on 6/7. Instructed pt to hydrate well and to take Miralax for next couple of days with instructions on how to do so. Pt states she will.

## 2022-12-15 ENCOUNTER — Encounter: Payer: Self-pay | Admitting: Gastroenterology

## 2022-12-15 ENCOUNTER — Ambulatory Visit (AMBULATORY_SURGERY_CENTER): Payer: 59 | Admitting: Gastroenterology

## 2022-12-15 VITALS — BP 114/61 | HR 60 | Temp 98.0°F | Resp 15 | Ht 67.0 in | Wt 164.0 lb

## 2022-12-15 DIAGNOSIS — Z1211 Encounter for screening for malignant neoplasm of colon: Secondary | ICD-10-CM

## 2022-12-15 DIAGNOSIS — D123 Benign neoplasm of transverse colon: Secondary | ICD-10-CM

## 2022-12-15 DIAGNOSIS — D120 Benign neoplasm of cecum: Secondary | ICD-10-CM | POA: Diagnosis not present

## 2022-12-15 DIAGNOSIS — K635 Polyp of colon: Secondary | ICD-10-CM | POA: Diagnosis not present

## 2022-12-15 DIAGNOSIS — D122 Benign neoplasm of ascending colon: Secondary | ICD-10-CM | POA: Diagnosis not present

## 2022-12-15 MED ORDER — SODIUM CHLORIDE 0.9 % IV SOLN: 500.0000 mL | INTRAVENOUS | Status: DC

## 2022-12-15 NOTE — Progress Notes (Signed)
A and O x3. Report to RN. Tolerated MAC anesthesia well. 

## 2022-12-15 NOTE — Progress Notes (Signed)
Called to room to assist during endoscopic procedure.  Patient ID and intended procedure confirmed with present staff. Received instructions for my participation in the procedure from the performing physician.  

## 2022-12-15 NOTE — Progress Notes (Signed)
[facility] History and Physical   Primary Care Physician:  Eartha Inch, MD   Reason for Procedure:   Colon cancer screening  Plan:    colonoscopy     HPI: Victoria Mcintyre is a 47 y.o. female  here for colonoscopy screening - first time exam.   Patient denies any bowel symptoms at this time. No family history of colon cancer known. Otherwise feels well without any cardiopulmonary symptoms.   I have discussed risks / benefits of anesthesia and endoscopic procedure with Rudi Coco and they wish to proceed with the exams as outlined today.    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 Authorizing Provider  levonorgestrel-ethinyl estradiol (ALESSE) 0.1-20 MG-MCG tablet Take 1 tablet by mouth daily. 08/31/22  Yes Jerene Bears, MD  sertraline (ZOLOFT) 25 MG tablet Take 25 mg by mouth daily.   Yes [provider]  clobetasol ointment (TEMOVATE) 0.05 % Use small amount 2 times day for flare.  Do not use for more than 5 days each month. 08/31/22   Jerene Bears, MD  levocetirizine (XYZAL) 5 MG tablet Take 5 mg by mouth as needed for allergies.    [provider]    Current Outpatient Medications  Medication Sig Dispense Refill   levonorgestrel-ethinyl estradiol (ALESSE) 0.1-20 MG-MCG tablet Take 1 tablet by mouth daily. 84 tablet 3   sertraline (ZOLOFT) 25 MG tablet Take 25 mg by mouth daily.     clobetasol ointment (TEMOVATE) 0.05 % Use small amount 2 times day for flare.  Do  not use for more than 5 days each month. 30 g 1   levocetirizine (XYZAL) 5 MG tablet Take 5 mg by mouth as needed for allergies.     Current Facility-Administered Medications  Medication Dose Route Frequency Provider Last Rate Last Admin   0.9 %  sodium chloride infusion  500 mL Intravenous Continuous Analia Zuk, Willaim Rayas, MD        Allergies as of 12/15/2022   (No Known Allergies)    Family History  Problem Relation Age of Onset   Osteoarthritis Father    Heart disease Father    Seizures Father    Autoimmune disease Father    Asthma Brother    Cancer Paternal Aunt        melanoma   Breast cancer Paternal Grandmother    Heart failure Paternal Grandmother    Cancer Paternal Grandmother        pancreatic   Diabetes Paternal Grandmother    Heart failure Paternal Grandfather    Colon cancer Neg Hx    Colon polyps Neg Hx    Rectal cancer Neg Hx    Stomach cancer Neg Hx     Social History   Socioeconomic History   Marital status: Married    Spouse name: Not on file   Number of children: Not on file   Years of education: Not on file   Highest education level: Not on file  Occupational History  Not on file  Tobacco Use   Smoking status: Never   Smokeless tobacco: Never  Vaping Use   Vaping Use: Never used  Substance and Sexual Activity   Alcohol use: Yes    Alcohol/week: 4.0 standard drinks of alcohol    Types: 4 Glasses of wine per week    Comment: occ wine or beer   Drug use: No   Sexual activity: Yes    Partners: Male    Birth control/protection: OCP  Other Topics Concern   Not on file  Social History Narrative   Not on file   Social Determinants of Health   Financial Resource Strain: Not on file  Food Insecurity: Patient Declined (04/18/2022)   Hunger Vital Sign    Worried About Running Out of Food in the Last Year: Patient declined    Ran Out of Food in the Last Year: Patient declined  Transportation Needs: No Transportation Needs (04/18/2022)    PRAPARE - Administrator, Civil Service (Medical): No    Lack of Transportation (Non-Medical): No  Physical Activity: Not on file  Stress: Not on file  Social Connections: Not on file  Intimate Partner Violence: Not At Risk (04/18/2022)   Humiliation, Afraid, Rape, and Kick questionnaire    Fear of Current or Ex-Partner: No    Emotionally Abused: No    Physically Abused: No    Sexually Abused: No    Review of Systems: All other review of systems negative except as mentioned in the HPI.  Physical Exam: Vital signs BP 112/62   Pulse (!) 59   Temp 98 F (36.7 C)   Ht 5\' 7"  (1.702 m)   Wt 164 lb (74.4 kg)   SpO2 100%   BMI 25.69 kg/m   General:   Alert,  Well-developed, pleasant and cooperative in NAD Lungs:  Clear throughout to auscultation.   Heart:  Regular rate and rhythm Abdomen:  Soft, nontender and nondistended.   Neuro/Psych:  Alert and cooperative. Normal mood and affect. A and O x 3  Harlin Rain, MD [facility]

## 2022-12-15 NOTE — Patient Instructions (Signed)
YOU HAD AN ENDOSCOPIC PROCEDURE TODAY AT THE [facility]:   Refer to the procedure report that was given to you for any specific questions about what was found during the examination.  If the procedure report does not answer your questions, please call your gastroenterologist to clarify.  If you requested that your care partner not be given the details of your procedure findings, then the procedure report has been included in a sealed envelope for you to review at your convenience later.  **Handouts given on polyps, Hemorrhoids and Diverticulosis**  YOU SHOULD EXPECT: Some feelings of bloating in the abdomen. Passage of more gas than usual.  Walking can help get rid of the air that was put into your GI tract during the procedure and reduce the bloating. If you had a lower endoscopy (such as a colonoscopy or flexible sigmoidoscopy) you may notice spotting of blood in your stool or on the toilet paper. If you underwent a bowel prep for your procedure, you may not have a normal bowel movement for a few days.  Please Note:  You might notice some irritation and congestion in your nose or some drainage.  This is from the oxygen used during your procedure.  There is no need for concern and it should clear up in a day or so.  SYMPTOMS TO REPORT IMMEDIATELY:  Following lower endoscopy (colonoscopy or flexible sigmoidoscopy):  Excessive amounts of blood in the stool  Significant tenderness or worsening of abdominal pains  Swelling of the abdomen that is new, acute  Fever of 100F or higher  For urgent or emergent issues, a gastroenterologist can be reached at any hour by calling [phone]. Do not use MyChart messaging for urgent concerns.    DIET:  We do recommend a small meal at first, but then you may proceed to your regular diet.  Drink plenty of fluids but you should avoid alcoholic beverages for 24 hours.  ACTIVITY:  You should plan to take it easy for the rest of today and you  should NOT DRIVE or use heavy machinery until tomorrow (because of the sedation medicines used during the test).    FOLLOW UP: Our staff will call the number listed on your records the next business day following your procedure.  We will call around 7:15- 8:00 am to check on you and address any questions or concerns that you may have regarding the information given to you following your procedure. If we do not reach you, we will leave a message.     If any biopsies were taken you will be contacted by phone or by letter within the next 1-3 weeks.  Please call us at [phone] if you have not heard about the biopsies in 3 weeks.    SIGNATURES/CONFIDENTIALITY: You and/or your care partner have signed paperwork which will be entered into your electronic medical record.  These signatures attest to the fact that that the information above on your After Visit Summary has been reviewed and is understood.  Full responsibility of the confidentiality of this discharge information lies with you and/or your care-partner.

## 2022-12-15 NOTE — Op Note (Signed)
[facility] Patient Name: Victoria Mcintyre Procedure Date: 12/15/2022 9:56 AM MRN: [id number] Endoscopist: Viviann Spare P. Adela Lank , MD, [id number] Age: 47 Referring MD:  Date of Birth: January 21, 1976 Gender: Female Account #: [id number] Procedure:                Colonoscopy Indications:              Screening for colorectal malignant neoplasm, This                            is the patient's first colonoscopy Medicines:                Monitored Anesthesia Care Procedure:                Pre-Anesthesia Assessment:                           - Prior to the procedure, a History and Physical                            was performed, and patient medications and                            allergies were reviewed. The patient's tolerance of                            previous anesthesia was also reviewed. The risks                            and benefits of the procedure and the sedation                            options and risks were discussed with the patient.                            All questions were answered, and informed consent                            was obtained. Prior Anticoagulants: The patient has                            taken no anticoagulant or antiplatelet agents. ASA                            Grade Assessment: II - A patient with mild systemic                            disease. After reviewing the risks and benefits,                            the patient was deemed in satisfactory condition to                            undergo the procedure.  After obtaining informed consent, the colonoscope                            was passed under direct vision. Throughout the                            procedure, the patient's blood pressure, pulse, and                            oxygen saturations were monitored continuously. The                            [id number]-HQ190L Colonoscope [id number] was introduced                            through the anus and  advanced to the the cecum,                            identified by appendiceal orifice and ileocecal                            valve. The colonoscopy was performed without                            difficulty. The patient tolerated the procedure                            well. The quality of the bowel preparation was                            good. The ileocecal valve, appendiceal orifice, and                            rectum were photographed. Scope In: 10:09:27 AM Scope Out: 10:30:32 AM Scope Withdrawal Time: 0 hours 16 minutes 48 seconds  Total Procedure Duration: 0 hours 21 minutes 5 seconds  Findings:                 The perianal and digital rectal examinations were                            normal.                           A 3 mm polyp was found in the ileocecal valve. The                            polyp was sessile. The polyp was removed with a                            cold snare. Resection and retrieval were complete.                           A 12 mm polyp was found in the ascending colon. The  polyp was flat. The polyp was removed with a cold                            snare. Resection and retrieval were complete.                           A 10 to 12 mm polyp was found in the transverse                            colon. The polyp was flat. The polyp was removed                            with a cold snare. Resection and retrieval were                            complete.                           A few small-mouthed diverticula were found in the                            sigmoid colon.                           Internal hemorrhoids were found during                            retroflexion. The hemorrhoids were small.                           The exam was otherwise without abnormality. Complications:            No immediate complications. Estimated blood loss:                            Minimal. Estimated Blood Loss:     Estimated blood  loss was minimal. Impression:               - One 3 mm polyp in the IC valve, removed with a                            cold snare. Resected and retrieved.                           - One 12 mm polyp in the ascending colon, removed                            with a cold snare. Resected and retrieved.                           - One 10 to 12 mm polyp in the transverse colon,                            removed with a cold snare. Resected and retrieved.                           -  Diverticulosis in the sigmoid colon.                           - Internal hemorrhoids.                           - The examination was otherwise normal.                           - The GI Genius (intelligent endoscopy module),                            computer-aided polyp detection system powered by AI                            was utilized to detect colorectal polyps through                            enhanced visualization during colonoscopy. Recommendation:           - Patient has a contact number available for                            emergencies. The signs and symptoms of potential                            delayed complications were discussed with the                            patient. Return to normal activities tomorrow.                            Written discharge instructions were provided to the                            patient.                           - Resume previous diet.                           - Continue present medications.                           - Await pathology results. Anticipate repeat                            colonoscopy in 3 years given size of polyps removed                            today. Viviann Spare P. Tukker Byrns, MD 12/15/2022 10:35:21 AM This report has been signed electronically.

## 2022-12-15 NOTE — Progress Notes (Signed)
Pt states no changes to meds or health hx since previsit.

## 2022-12-18 ENCOUNTER — Telehealth: Payer: Self-pay

## 2022-12-18 NOTE — Telephone Encounter (Signed)
Left message on follow up call. 

## 2022-12-25 ENCOUNTER — Encounter: Payer: Self-pay | Admitting: Gastroenterology

## 2023-09-10 ENCOUNTER — Other Ambulatory Visit (HOSPITAL_BASED_OUTPATIENT_CLINIC_OR_DEPARTMENT_OTHER): Payer: Self-pay | Admitting: Obstetrics & Gynecology

## 2023-09-10 DIAGNOSIS — Z3041 Encounter for surveillance of contraceptive pills: Secondary | ICD-10-CM

## 2023-10-30 ENCOUNTER — Ambulatory Visit (HOSPITAL_BASED_OUTPATIENT_CLINIC_OR_DEPARTMENT_OTHER): Payer: 59 | Admitting: Obstetrics & Gynecology

## 2023-10-30 ENCOUNTER — Other Ambulatory Visit (HOSPITAL_COMMUNITY)
Admission: RE | Admit: 2023-10-30 | Discharge: 2023-10-30 | Disposition: A | Discharge status: Home / Self Care | Source: Ambulatory Visit | Attending: Obstetrics & Gynecology | Admitting: Obstetrics & Gynecology

## 2023-10-30 ENCOUNTER — Encounter (HOSPITAL_BASED_OUTPATIENT_CLINIC_OR_DEPARTMENT_OTHER): Payer: Self-pay | Admitting: Obstetrics & Gynecology

## 2023-10-30 VITALS — BP 106/62 | HR 75 | Ht 67.0 in | Wt 162.6 lb

## 2023-10-30 DIAGNOSIS — Z3041 Encounter for surveillance of contraceptive pills: Secondary | ICD-10-CM

## 2023-10-30 DIAGNOSIS — R61 Generalized hyperhidrosis: Secondary | ICD-10-CM | POA: Diagnosis not present

## 2023-10-30 DIAGNOSIS — N763 Subacute and chronic vulvitis: Secondary | ICD-10-CM

## 2023-10-30 DIAGNOSIS — Z01419 Encounter for gynecological examination (general) (routine) without abnormal findings: Secondary | ICD-10-CM

## 2023-10-30 DIAGNOSIS — Z124 Encounter for screening for malignant neoplasm of cervix: Secondary | ICD-10-CM

## 2023-10-30 MED ORDER — LEVONORGESTREL-ETHINYL ESTRAD 0.1-20 MG-MCG PO TABS: 1.0000 | ORAL_TABLET | Freq: Every day | ORAL | 3 refills | Status: DC

## 2023-10-30 MED ORDER — CLOBETASOL PROPIONATE 0.05 % EX OINT
TOPICAL_OINTMENT | CUTANEOUS | 1 refills | Status: AC
Start: 2023-10-30 — End: ?

## 2023-10-30 NOTE — Progress Notes (Signed)
 ANNUAL EXAM Patient name: Briele Lagasse MRN [id number]  Date of birth: 04-23-1976 Chief Complaint:   Annual Exam  History of Present Illness:   Stayce Delancy is a 48 y.o. G47P1001 Caucasian female being seen today for a routine annual exam.  Doing well.  Cycles are regular but did skip one this past year.  Having night sweats and decreased libido.  She has also experienced some vaginal dryness.     Patient's last menstrual period was 10/03/2023.   Upstream - 10/30/23 1441       Pregnancy Intention Screening   Does the patient want to become pregnant in the next year? No    Does the patient's partner want to become pregnant in the next year? No    Would the patient like to discuss contraceptive options today? No             Last pap 04/27/2021. Results were:  neg with neg HR HPV . Last mammogram: 07/10/2023 . Results were: normal. Family h/o breast cancer: no Last colonoscopy: 12/15/2022. Results were: abnormal sessile polyps . Family h/o colorectal cancer: no     08/31/2022    2:22 PM 04/27/2021    8:22 AM  Depression screen PHQ 2/9  Decreased Interest 0 0  Down, Depressed, Hopeless 0 0  PHQ - 2 Score 0 0    Review of Systems:   Pertinent items are noted in HPI  Denies any urinary or bowel changes.   Pertinent History Reviewed:  Reviewed past medical,surgical, social and family history.  Reviewed problem list, medications and allergies. Physical Assessment:   Vitals:   10/30/23 1440  BP: 106/62  Pulse: 75  Weight: 162 lb 9.6 oz (73.8 kg)  Height: 5\' 7"  (1.702 m)  Body mass index is 25.47 kg/m.        Physical Examination:   General appearance - well appearing, and in no distress  Mental status - alert, oriented to person, place, and time  Psych:  She has a normal mood and affect  Skin - warm and dry, normal color, no suspicious lesions noted  Chest - effort normal, all lung fields clear to auscultation bilaterally  Heart - normal rate and regular  rhythm  Neck:  midline trachea, no thyromegaly or nodules  Breasts - breasts appear normal, no suspicious masses, no skin or nipple changes or  axillary nodes  Abdomen - soft, nontender, nondistended, no masses or organomegaly  Pelvic - VULVA: normal appearing vulva with no masses, tenderness or lesions    VAGINA: normal appearing vagina with normal color and discharge, no lesions    CERVIX: normal appearing cervix without discharge or lesions, no CMT  Thin prep pap is done and HR HPV cotesting  UTERUS: uterus is felt to be normal size, shape, consistency and nontender   ADNEXA: No adnexal masses or tenderness noted.  Rectal - normal rectal, good sphincter tone, no masses felt.   Extremities:  No swelling or varicosities noted  Chaperone present for exam  Assessment & Plan:  1. Well woman exam with routine gynecological exam (Primary) - Pap smear and HR HPV obtained today - Mammogram 06/2023 - Colonoscopy 2024, follow up 3 years - lab work done with PCP - vaccines reviewed/updated  2. Encounter for surveillance of contraceptive pills - levonorgestrel -ethinyl estradiol  (AUBRA EQ ) 0.1-20 MG-MCG tablet; Take 1 tablet by mouth daily.  Dispense: 84 tablet; Refill: 3  3. Night sweats - Anti mullerian hormone  4. Chronic vulvitis - clobetasol  ointment (TEMOVATE )  0.05 %; Use small amount 2 times day for flare.  Do not use for more than 5 days each month.  Dispense: 30 g; Refill: 1  5. Cervical cancer screening - Cytology - PAP( [place])    Meds:  Meds ordered this encounter  Medications   levonorgestrel -ethinyl estradiol  (AUBRA EQ ) 0.1-20 MG-MCG tablet    Sig: Take 1 tablet by mouth daily.    Dispense:  84 tablet    Refill:  3   clobetasol  ointment (TEMOVATE ) 0.05 %    Sig: Use small amount 2 times day for flare.  Do not use for more than 5 days each month.    Dispense:  30 g    Refill:  1    Follow-up: Return in about 1 year (around 10/29/2024).  Lillian Rein,  MD 10/30/2023 4:14 PM

## 2023-11-02 LAB — CYTOLOGY - PAP
Comment: NEGATIVE
Diagnosis: UNDETERMINED — AB
High risk HPV: NEGATIVE

## 2023-11-06 ENCOUNTER — Ambulatory Visit (HOSPITAL_BASED_OUTPATIENT_CLINIC_OR_DEPARTMENT_OTHER): Payer: 59 | Admitting: Obstetrics & Gynecology

## 2023-11-08 ENCOUNTER — Encounter (HOSPITAL_BASED_OUTPATIENT_CLINIC_OR_DEPARTMENT_OTHER): Payer: Self-pay | Admitting: Obstetrics & Gynecology

## 2023-11-09 LAB — ANTI MULLERIAN HORMONE: ANTI-MULLERIAN HORMONE (AMH): 0.031 ng/mL

## 2023-11-12 ENCOUNTER — Other Ambulatory Visit (HOSPITAL_BASED_OUTPATIENT_CLINIC_OR_DEPARTMENT_OTHER): Payer: Self-pay | Admitting: Obstetrics & Gynecology

## 2023-11-12 DIAGNOSIS — R6882 Decreased libido: Secondary | ICD-10-CM

## 2023-11-12 DIAGNOSIS — R61 Generalized hyperhidrosis: Secondary | ICD-10-CM

## 2023-11-12 MED ORDER — NONFORMULARY OR COMPOUNDED ITEM
1 refills | Status: AC
Start: 2023-11-12 — End: ?

## 2023-11-12 MED ORDER — PROGESTERONE MICRONIZED 100 MG PO CAPS
ORAL_CAPSULE | ORAL | 2 refills | Status: DC
Start: 2023-11-12 — End: 2024-01-14

## 2024-01-12 ENCOUNTER — Other Ambulatory Visit (HOSPITAL_BASED_OUTPATIENT_CLINIC_OR_DEPARTMENT_OTHER): Payer: Self-pay | Admitting: Obstetrics & Gynecology

## 2024-01-12 DIAGNOSIS — R61 Generalized hyperhidrosis: Secondary | ICD-10-CM

## 2024-04-16 ENCOUNTER — Other Ambulatory Visit (HOSPITAL_BASED_OUTPATIENT_CLINIC_OR_DEPARTMENT_OTHER): Payer: Self-pay | Admitting: Obstetrics & Gynecology

## 2024-04-16 DIAGNOSIS — R61 Generalized hyperhidrosis: Secondary | ICD-10-CM

## 2024-05-29 ENCOUNTER — Encounter (HOSPITAL_BASED_OUTPATIENT_CLINIC_OR_DEPARTMENT_OTHER): Payer: Self-pay | Admitting: Obstetrics & Gynecology

## 2024-06-12 ENCOUNTER — Ambulatory Visit (HOSPITAL_BASED_OUTPATIENT_CLINIC_OR_DEPARTMENT_OTHER): Admitting: Obstetrics and Gynecology

## 2024-06-12 VITALS — BP 111/72 | HR 67 | Ht 67.0 in | Wt 164.6 lb

## 2024-06-12 DIAGNOSIS — N898 Other specified noninflammatory disorders of vagina: Secondary | ICD-10-CM

## 2024-06-12 DIAGNOSIS — N912 Amenorrhea, unspecified: Secondary | ICD-10-CM

## 2024-06-12 MED ORDER — ESTRADIOL 0.01 % VA CREA: TOPICAL_CREAM | VAGINAL | 3 refills | Status: DC

## 2024-06-12 NOTE — Progress Notes (Signed)
   GYNECOLOGY PROGRESS NOTE  History:  48 y.o. G3P1001 presents to [facility], doing well on HRT, for symptoms, has not had a cycle since August. She was having light periods prior   Taking OCP, testosterone and prometrium   She does endorse vaginal dryness and she was previously on vaginal estrogen that she reported improved her symptoms.    Health Maintenance Due  Topic Date Due   Hepatitis B Vaccines 19-59 Average Risk (1 of 3 - 19+ 3-dose series) Never done   Mammogram  05/21/2020   COVID-19 Vaccine (6 - 2025-26 season) 03/10/2024     Review of Systems:  Pertinent items are noted in HPI.   Objective:  Physical Exam Blood pressure 111/72, pulse 67, height 5' 7 (1.702 m), weight 164 lb 9.6 oz (74.7 kg). VS reviewed, nursing note reviewed,  Constitutional: well developed, well nourished, no distress HEENT: normocephalic Pulm/chest wall: normal effort Abdomen: soft Neuro: alert and oriented  Skin: warm, dry Psych: affect normal Pelvic exam: deferred  Assessment & Plan:  1. Amenorrhea (Primary) Likely d/t perimenopause and current regimen. Continue current regimen, plan to recheck labs in 6 months Follow up if worsening s&s   2. Vaginal dryness Plan to trial vaginal estrogen  - estradiol  (ESTRACE ) 0.01 % CREA vaginal cream; Apply 0.5 gram per vagina every night for 2 weeks, then apply 0.5g twice a week  Dispense: 30 g; Refill: 3   Nidia Daring, FNP

## 2024-06-12 NOTE — Progress Notes (Signed)
 Pt states hormones were helping with menopause but no period since July or August .

## 2024-06-15 ENCOUNTER — Encounter (HOSPITAL_BASED_OUTPATIENT_CLINIC_OR_DEPARTMENT_OTHER): Payer: Self-pay

## 2024-06-23 ENCOUNTER — Other Ambulatory Visit (HOSPITAL_BASED_OUTPATIENT_CLINIC_OR_DEPARTMENT_OTHER): Payer: Self-pay

## 2024-06-23 DIAGNOSIS — N898 Other specified noninflammatory disorders of vagina: Secondary | ICD-10-CM

## 2024-06-23 MED ORDER — ESTRADIOL 0.01 % VA CREA: TOPICAL_CREAM | VAGINAL | 3 refills | Status: AC

## 2024-07-21 ENCOUNTER — Other Ambulatory Visit (HOSPITAL_BASED_OUTPATIENT_CLINIC_OR_DEPARTMENT_OTHER): Payer: Self-pay | Admitting: Obstetrics & Gynecology

## 2024-07-21 DIAGNOSIS — R61 Generalized hyperhidrosis: Secondary | ICD-10-CM

## 2024-07-28 ENCOUNTER — Other Ambulatory Visit (HOSPITAL_BASED_OUTPATIENT_CLINIC_OR_DEPARTMENT_OTHER): Payer: Self-pay

## 2024-07-28 DIAGNOSIS — R6882 Decreased libido: Secondary | ICD-10-CM

## 2024-07-29 ENCOUNTER — Other Ambulatory Visit (HOSPITAL_BASED_OUTPATIENT_CLINIC_OR_DEPARTMENT_OTHER): Payer: Self-pay | Admitting: Obstetrics & Gynecology

## 2024-07-29 DIAGNOSIS — R6882 Decreased libido: Secondary | ICD-10-CM

## 2024-07-29 NOTE — Telephone Encounter (Signed)
 Left message to call Victoria Mcintyre at [phone].

## 2024-07-30 ENCOUNTER — Encounter (HOSPITAL_BASED_OUTPATIENT_CLINIC_OR_DEPARTMENT_OTHER): Payer: Self-pay

## 2024-07-30 NOTE — Telephone Encounter (Signed)
 Sent MyChart message to patient as well. tbw

## 2024-07-30 NOTE — Telephone Encounter (Signed)
 Patient responded and will be coming in to have blood work drawn. tbw

## 2024-07-30 NOTE — Telephone Encounter (Signed)
 LMOM at 9:34 for patient to call office to receive the below information per Dr. Cleotilde. tbw

## 2024-07-30 NOTE — Telephone Encounter (Signed)
 LMOM at 9:16 for patient to call office. tbw

## 2024-08-15 ENCOUNTER — Other Ambulatory Visit (HOSPITAL_BASED_OUTPATIENT_CLINIC_OR_DEPARTMENT_OTHER): Payer: Self-pay | Admitting: Obstetrics & Gynecology

## 2024-08-15 DIAGNOSIS — Z3041 Encounter for surveillance of contraceptive pills: Secondary | ICD-10-CM

## 2024-10-31 ENCOUNTER — Ambulatory Visit (HOSPITAL_BASED_OUTPATIENT_CLINIC_OR_DEPARTMENT_OTHER): Admitting: Obstetrics & Gynecology
